# Patient Record
Sex: Female | Born: 1990 | Race: White | Hispanic: No | Marital: Married | Smoking: Never smoker
Health system: Southern US, Community
[De-identification: ages and names within clinical notes are randomized; demographics above are authoritative.]

## PROBLEM LIST (undated history)

## (undated) DIAGNOSIS — Z789 Other specified health status: Secondary | ICD-10-CM

## (undated) HISTORY — PX: WISDOM TOOTH EXTRACTION: SHX21

---
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
## 2016-07-13 DIAGNOSIS — R87610 Atypical squamous cells of undetermined significance on cytologic smear of cervix (ASC-US): Secondary | ICD-10-CM | POA: Diagnosis not present

## 2016-07-13 DIAGNOSIS — Z01419 Encounter for gynecological examination (general) (routine) without abnormal findings: Secondary | ICD-10-CM | POA: Diagnosis not present

## 2016-07-13 DIAGNOSIS — Z0000 Encounter for general adult medical examination without abnormal findings: Secondary | ICD-10-CM | POA: Diagnosis not present

## 2016-07-13 DIAGNOSIS — Z13228 Encounter for screening for other metabolic disorders: Secondary | ICD-10-CM | POA: Diagnosis not present

## 2016-07-13 DIAGNOSIS — Z113 Encounter for screening for infections with a predominantly sexual mode of transmission: Secondary | ICD-10-CM | POA: Diagnosis not present

## 2017-08-02 DIAGNOSIS — R6889 Other general symptoms and signs: Secondary | ICD-10-CM | POA: Diagnosis not present

## 2017-08-02 DIAGNOSIS — R233 Spontaneous ecchymoses: Secondary | ICD-10-CM | POA: Diagnosis not present

## 2017-10-06 ENCOUNTER — Other Ambulatory Visit: Payer: Self-pay | Admitting: Family Medicine

## 2017-10-06 ENCOUNTER — Other Ambulatory Visit (HOSPITAL_COMMUNITY)
Admission: RE | Admit: 2017-10-06 | Discharge: 2017-10-06 | Disposition: A | Discharge status: Home / Self Care | Payer: 59 | Source: Ambulatory Visit | Attending: Family Medicine | Admitting: Family Medicine

## 2017-10-06 DIAGNOSIS — Z01419 Encounter for gynecological examination (general) (routine) without abnormal findings: Secondary | ICD-10-CM | POA: Diagnosis not present

## 2017-10-06 DIAGNOSIS — Z124 Encounter for screening for malignant neoplasm of cervix: Secondary | ICD-10-CM | POA: Diagnosis not present

## 2017-10-06 DIAGNOSIS — Z131 Encounter for screening for diabetes mellitus: Secondary | ICD-10-CM | POA: Diagnosis not present

## 2017-10-06 DIAGNOSIS — Z0000 Encounter for general adult medical examination without abnormal findings: Secondary | ICD-10-CM | POA: Diagnosis not present

## 2017-10-06 DIAGNOSIS — Z13220 Encounter for screening for lipoid disorders: Secondary | ICD-10-CM | POA: Diagnosis not present

## 2017-10-06 DIAGNOSIS — Z23 Encounter for immunization: Secondary | ICD-10-CM | POA: Diagnosis not present

## 2017-10-12 LAB — CYTOLOGY - PAP
Diagnosis: UNDETERMINED — AB
HPV: NOT DETECTED

## 2017-10-21 DIAGNOSIS — R87610 Atypical squamous cells of undetermined significance on cytologic smear of cervix (ASC-US): Secondary | ICD-10-CM | POA: Diagnosis not present

## 2017-11-02 DIAGNOSIS — Z111 Encounter for screening for respiratory tuberculosis: Secondary | ICD-10-CM | POA: Diagnosis not present

## 2017-12-08 DIAGNOSIS — Z23 Encounter for immunization: Secondary | ICD-10-CM | POA: Diagnosis not present

## 2018-01-27 DIAGNOSIS — Z23 Encounter for immunization: Secondary | ICD-10-CM | POA: Diagnosis not present

## 2018-04-15 DIAGNOSIS — Z23 Encounter for immunization: Secondary | ICD-10-CM | POA: Diagnosis not present

## 2018-11-03 DIAGNOSIS — Z111 Encounter for screening for respiratory tuberculosis: Secondary | ICD-10-CM | POA: Diagnosis not present

## 2019-02-01 ENCOUNTER — Other Ambulatory Visit: Payer: Self-pay | Admitting: Obstetrics and Gynecology

## 2019-02-01 ENCOUNTER — Other Ambulatory Visit (HOSPITAL_COMMUNITY)
Admission: RE | Admit: 2019-02-01 | Discharge: 2019-02-01 | Disposition: A | Discharge status: Home / Self Care | Payer: BC Managed Care – PPO | Source: Ambulatory Visit | Attending: Obstetrics and Gynecology | Admitting: Obstetrics and Gynecology

## 2019-02-01 DIAGNOSIS — Z01419 Encounter for gynecological examination (general) (routine) without abnormal findings: Secondary | ICD-10-CM | POA: Diagnosis not present

## 2019-02-02 ENCOUNTER — Other Ambulatory Visit: Payer: Self-pay | Admitting: Obstetrics and Gynecology

## 2019-02-02 DIAGNOSIS — N6310 Unspecified lump in the right breast, unspecified quadrant: Secondary | ICD-10-CM

## 2019-02-06 LAB — CYTOLOGY - PAP
Diagnosis: NEGATIVE
Diagnosis: REACTIVE

## 2019-02-08 ENCOUNTER — Other Ambulatory Visit: Payer: Self-pay

## 2019-02-08 ENCOUNTER — Ambulatory Visit
Admission: RE | Admit: 2019-02-08 | Discharge: 2019-02-08 | Disposition: A | Discharge status: Home / Self Care | Payer: 59 | Source: Ambulatory Visit | Attending: Obstetrics and Gynecology | Admitting: Obstetrics and Gynecology

## 2019-02-08 DIAGNOSIS — N6310 Unspecified lump in the right breast, unspecified quadrant: Secondary | ICD-10-CM

## 2019-02-09 ENCOUNTER — Other Ambulatory Visit: Payer: 59

## 2019-03-15 ENCOUNTER — Other Ambulatory Visit: Payer: Self-pay

## 2019-03-15 DIAGNOSIS — Z20822 Contact with and (suspected) exposure to covid-19: Secondary | ICD-10-CM

## 2019-03-16 LAB — NOVEL CORONAVIRUS, NAA: SARS-CoV-2, NAA: NOT DETECTED

## 2020-09-09 ENCOUNTER — Other Ambulatory Visit: Payer: Self-pay | Admitting: Obstetrics & Gynecology

## 2020-09-09 DIAGNOSIS — Z3141 Encounter for fertility testing: Secondary | ICD-10-CM

## 2020-09-16 ENCOUNTER — Ambulatory Visit
Admission: RE | Admit: 2020-09-16 | Discharge: 2020-09-16 | Disposition: A | Discharge status: Home / Self Care | Payer: BC Managed Care – PPO | Source: Ambulatory Visit | Attending: Obstetrics & Gynecology | Admitting: Obstetrics & Gynecology

## 2020-09-16 DIAGNOSIS — Z3141 Encounter for fertility testing: Secondary | ICD-10-CM

## 2021-01-21 ENCOUNTER — Other Ambulatory Visit (HOSPITAL_COMMUNITY): Payer: Self-pay

## 2021-01-21 MED ORDER — CHORIOGONADOTROPIN ALFA 250 MCG/0.5ML ~~LOC~~ INJ
INJECTION | SUBCUTANEOUS | 0 refills | Status: DC
  Filled 2021-01-21: qty 0.5, 30d supply, fill #0

## 2021-02-19 ENCOUNTER — Other Ambulatory Visit (HOSPITAL_COMMUNITY): Payer: Self-pay

## 2021-02-19 MED ORDER — CHORIOGONADOTROPIN ALFA 250 MCG/0.5ML ~~LOC~~ INJ
0.5000 mL | INJECTION | Freq: Every day | SUBCUTANEOUS | 0 refills | Status: DC
  Filled 2021-02-19: qty 0.5, 1d supply, fill #0

## 2021-05-13 ENCOUNTER — Other Ambulatory Visit (HOSPITAL_COMMUNITY): Payer: Self-pay

## 2021-05-13 MED ORDER — CLOMIPHENE CITRATE 50 MG PO TABS
50.0000 mg | ORAL_TABLET | ORAL | 0 refills | Status: DC
  Filled 2021-05-13: qty 1, 1d supply, fill #0

## 2021-05-19 ENCOUNTER — Other Ambulatory Visit (HOSPITAL_COMMUNITY): Payer: Self-pay

## 2021-05-19 IMAGING — RF DG HYSTEROGRAM
3 series · 10 of 10 positions shown · IV contrast (omnipaque)
Comparison: None.

CLINICAL DATA: Primary female infertility evaluation.

EXAM:
HYSTEROSALPINGOGRAM
TECHNIQUE: Following cleansing of the cervix and vagina with Betadine solution,
a hysterosalpingogram was performed using a 5-French
hysterosalpingogram catheter and Omnipaque 300 contrast. The patient
tolerated the examination without difficulty.

[Series 1: one shot · 1 of 1 slices shown (1 of 2)]
[im 1/1]
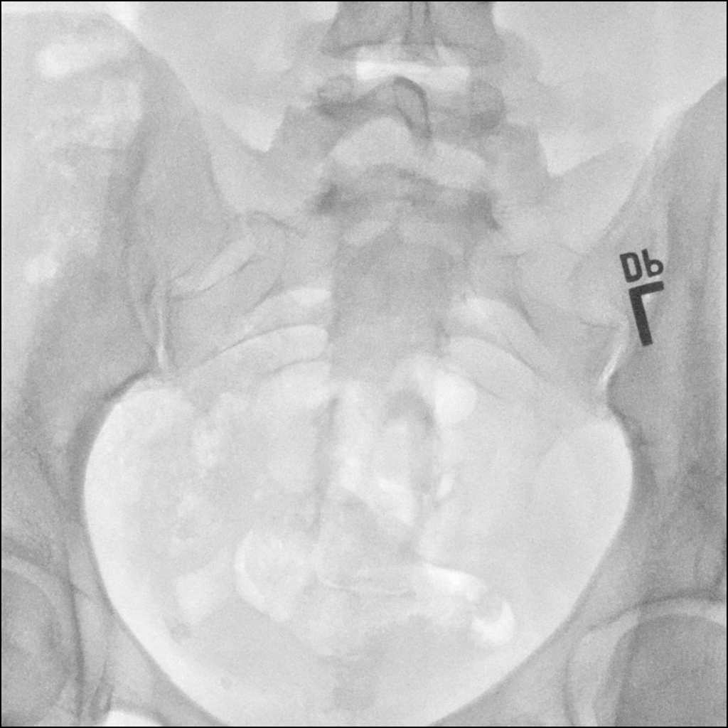

[Series 2: sequence · 4 of 14 frames shown]
[frame 3/14]
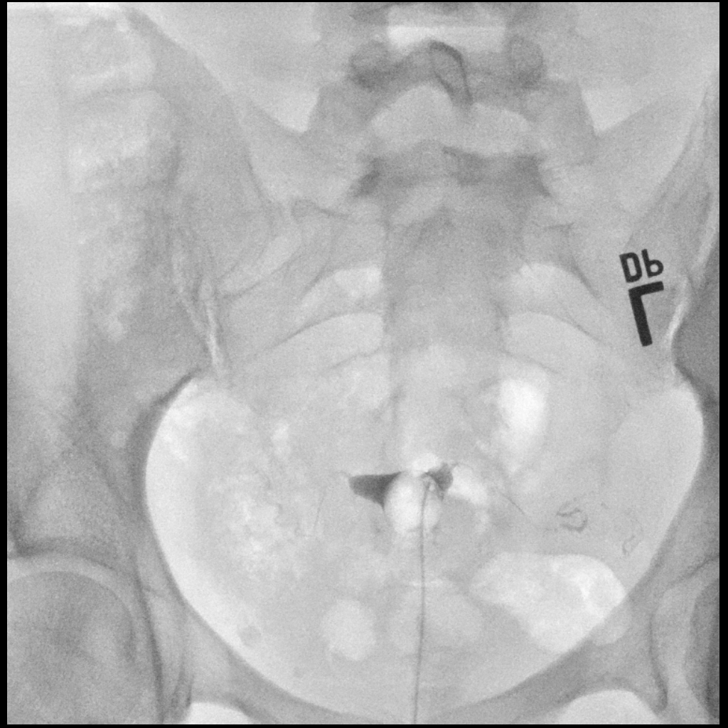
[frame 8/14]
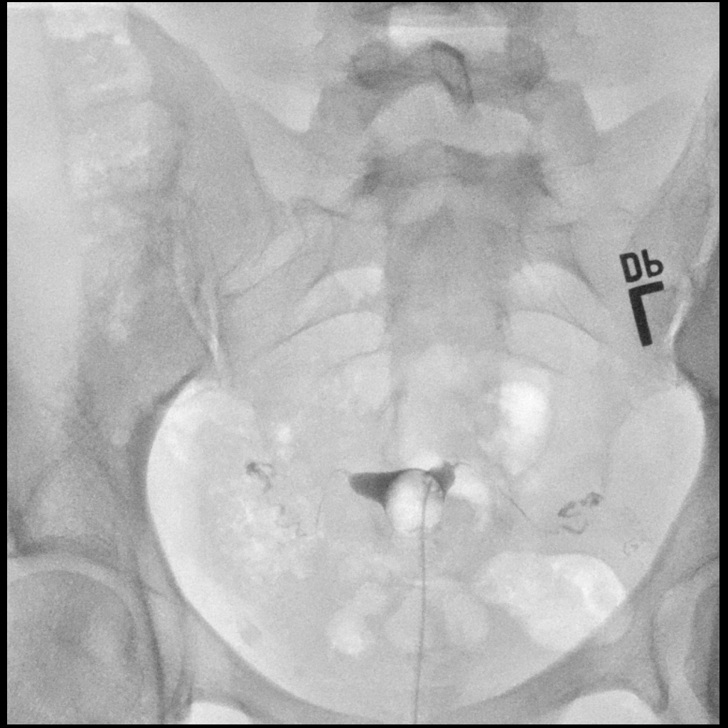
[frame 10/14]
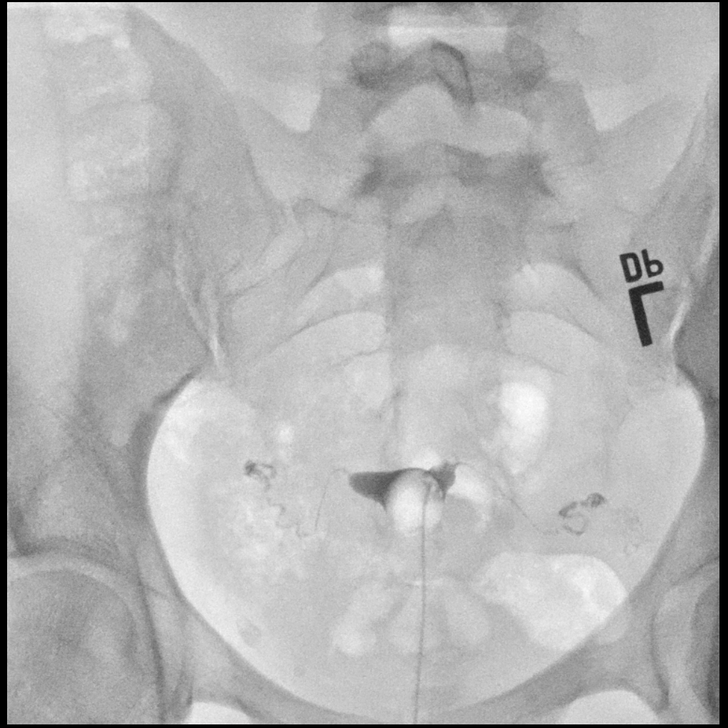
[frame 12/14]
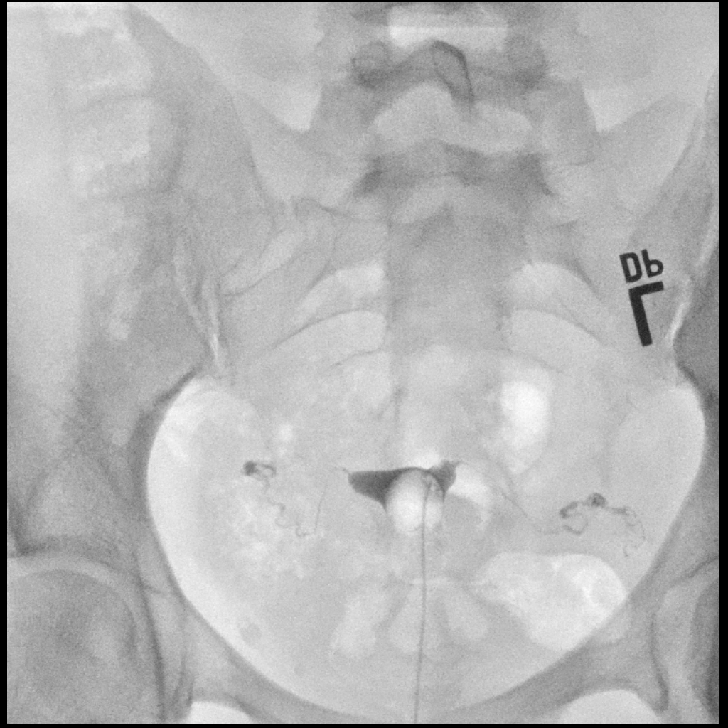

[Series 3: one shot · 0.15mm/px · 5 of 5 slices shown (2 of 2)]
[im 1/5]
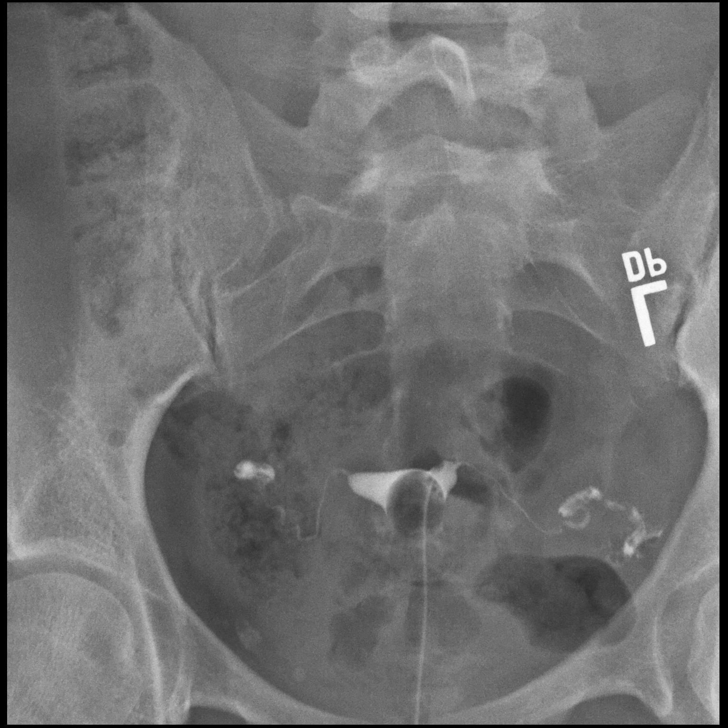
[im 2/5]
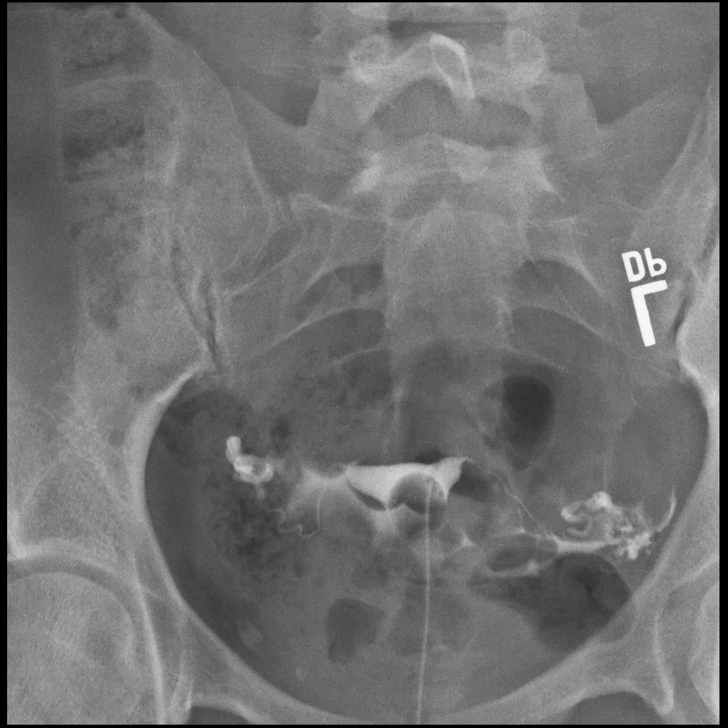
[im 3/5]
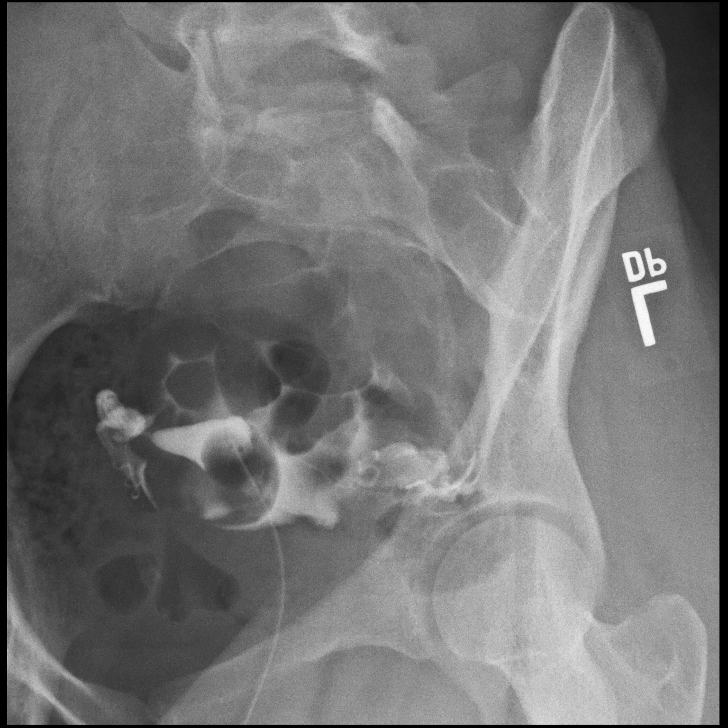
[im 4/5]
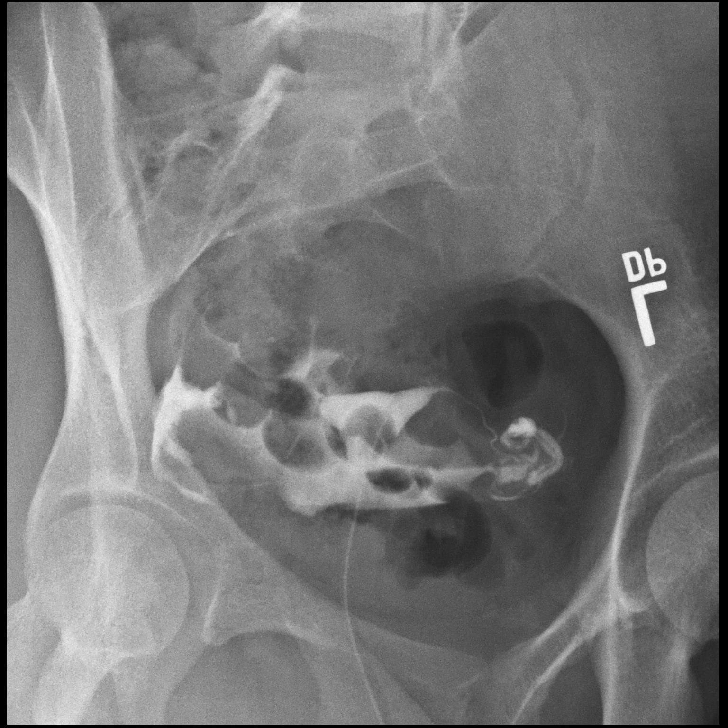
[im 5/5]
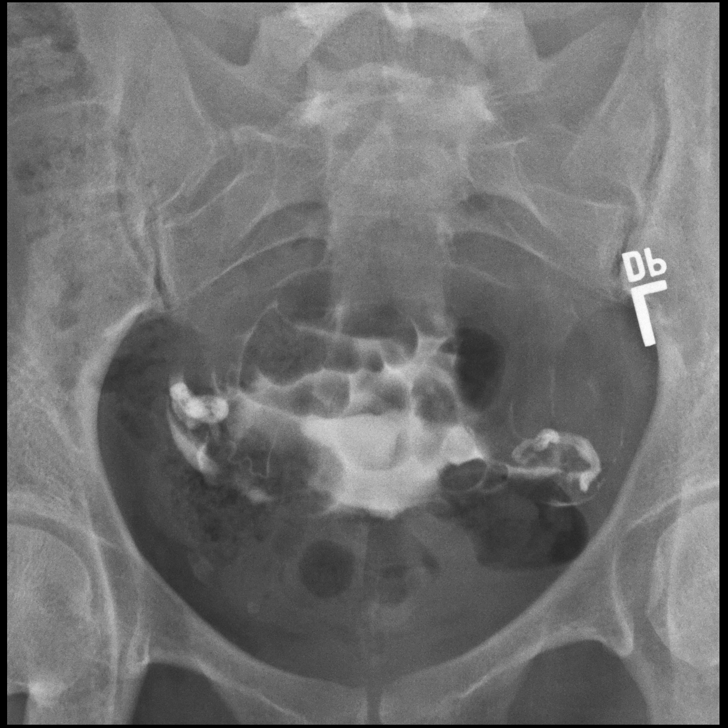

[10 of 10 positions shown; findings below may reference images not displayed]

FLUOROSCOPY TIME:  Radiation Exposure Index (as provided by the
fluoroscopic device): 7 mGy

Fluoroscopy Time:  0 minutes 30 seconds

Number of Acquired Images:  5
FINDINGS: Normal uterine cavity contour with no uterine cavity filling
defects.

Prompt opacification of the normal caliber and normal appearing
bilateral fallopian tubes in their entirety. Normal spillage of
contrast from the fimbriated ends of both fallopian tubes with
normal dispersal of contrast within the bilateral peritoneal cavity.
IMPRESSION: 1. Patent normal fallopian tubes bilaterally.
2. Normal uterine cavity.

## 2021-05-19 MED ORDER — OVIDREL 250 MCG/0.5ML ~~LOC~~ INJ
0.5000 mL | INJECTION | SUBCUTANEOUS | 0 refills | Status: DC
  Filled 2021-05-19: qty 0.5, 1d supply, fill #0

## 2021-07-03 LAB — HEPATITIS C ANTIBODY: HCV Ab: NEGATIVE

## 2021-07-03 LAB — OB RESULTS CONSOLE ANTIBODY SCREEN: Antibody Screen: NEGATIVE

## 2021-07-03 LAB — OB RESULTS CONSOLE HEPATITIS B SURFACE ANTIGEN: Hepatitis B Surface Ag: NEGATIVE

## 2021-07-03 LAB — OB RESULTS CONSOLE HIV ANTIBODY (ROUTINE TESTING): HIV: NONREACTIVE

## 2021-07-03 LAB — OB RESULTS CONSOLE ABO/RH: RH Type: NEGATIVE

## 2021-07-03 LAB — OB RESULTS CONSOLE RUBELLA ANTIBODY, IGM: Rubella: IMMUNE

## 2021-07-03 LAB — OB RESULTS CONSOLE RPR: RPR: NONREACTIVE

## 2021-07-17 LAB — OB RESULTS CONSOLE GC/CHLAMYDIA
Chlamydia: NEGATIVE
Neisseria Gonorrhea: NEGATIVE

## 2021-07-18 ENCOUNTER — Encounter (HOSPITAL_COMMUNITY): Payer: Self-pay | Admitting: Obstetrics and Gynecology

## 2021-07-18 ENCOUNTER — Inpatient Hospital Stay (HOSPITAL_COMMUNITY): Payer: BC Managed Care – PPO

## 2021-07-18 ENCOUNTER — Inpatient Hospital Stay (HOSPITAL_COMMUNITY)
Admission: AD | Admit: 2021-07-18 | Discharge: 2021-07-18 | Disposition: A | Discharge status: Home / Self Care | Payer: BC Managed Care – PPO | Attending: Obstetrics and Gynecology | Admitting: Obstetrics and Gynecology

## 2021-07-18 ENCOUNTER — Other Ambulatory Visit: Payer: Self-pay

## 2021-07-18 DIAGNOSIS — Z3A10 10 weeks gestation of pregnancy: Secondary | ICD-10-CM | POA: Insufficient documentation

## 2021-07-18 DIAGNOSIS — R1031 Right lower quadrant pain: Secondary | ICD-10-CM | POA: Insufficient documentation

## 2021-07-18 DIAGNOSIS — M25511 Pain in right shoulder: Secondary | ICD-10-CM | POA: Insufficient documentation

## 2021-07-18 DIAGNOSIS — O26891 Other specified pregnancy related conditions, first trimester: Secondary | ICD-10-CM | POA: Insufficient documentation

## 2021-07-18 DIAGNOSIS — M25512 Pain in left shoulder: Secondary | ICD-10-CM | POA: Insufficient documentation

## 2021-07-18 HISTORY — DX: Other specified health status: Z78.9

## 2021-07-18 NOTE — Discharge Instructions (Signed)
Return to MAU: °If you have heavy bleeding that soaks through more that 2 pads per hour for an hour or more °If you bleed so much that you feel like you might pass out or you do pass out °If you have significant abdominal pain that is not improved with Tylenol 1000 mg every 8 hours as needed for pain °If you develop a fever > 100.5  °

## 2021-07-18 NOTE — MAU Provider Note (Signed)
?History  ?  ? ?CSN: [id number] ? ?Arrival date and time: 07/18/21 1637 ? ? Event Date/Time  ? First Provider Initiated Contact with Patient 07/18/21 1805   ?  ? ?Chief Complaint  ?Patient presents with  ? Shoulder Pain  ? ?[name] is a 31 y.o. year old G3P0 female at [redacted]w[redacted]d weeks gestation who presents to MAU reporting sudden sharp pain just under her RT clavicle bone that occurs with deep breathing or laughing yesterday and some epigastric pain that went away. The pain in her clavicle came back this afternoon along with a sharp pain in RLQ; rated 5/10. She conceived this pregnancy using IUI and Clomid. She has had 2 ultrasounds documenting an IUP at 7 weeks and at 10.1 weeks (yesterday). She called the RN line and was told to come MAU for evaluation. She is concerned that she may have a heterotopic pregnancy, because she had 2 dominant follicles on her RT ovary and 1 dominant follicle on her LT ovary. Her mother is present and contributing to the history taking. ? ? ?OB History   ? ? Gravida  ?1  ? Para  ?   ? Term  ?   ? Preterm  ?   ? AB  ?   ? Living  ?   ?  ? ? SAB  ?   ? IAB  ?   ? Ectopic  ?   ? Multiple  ?   ? Live Births  ?   ?   ?  ?  ? ? ?Past Medical History:  ?Diagnosis Date  ? Medical history non-contributory   ? ? ?Past Surgical History:  ?Procedure Laterality Date  ? WISDOM TOOTH EXTRACTION    ? ? ?Family History  ?Problem Relation Age of Onset  ? Diabetes Father   ? ? ?Social History  ? ?Tobacco Use  ? Smoking status: Never  ? Smokeless tobacco: Never  ?Substance Use Topics  ? Alcohol use: Never  ? Drug use: Never  ? ? ?Allergies: No Known Allergies ? ?Medications Prior to Admission  ?Medication Sig Dispense Refill Last Dose  ? Prenatal Vit-Fe Fumarate-FA (PRENATAL MULTIVITAMIN) TABS tablet Take 1 tablet by mouth daily at 12 noon.   07/17/2021  ? progesterone (ENDOMETRIN) 100 MG vaginal insert Place 100 mg vaginally 2 (two) times daily.   07/17/2021  ? Choriogonadotropin Alfa 250  MCG/0.5ML injection Inject 0.5 mLs (0.25 mg total) into the skin daily for 1 day 0.5 mL 0   ? clomiPHENE (CLOMID) 50 MG tablet Take 1 tablet (50 mg total) by mouth daily on days 5 though 9 on cycle 1 tablet 0   ? ? ?Review of Systems  ?Constitutional: Negative.   ?HENT: Negative.    ?Eyes: Negative.   ?Respiratory: Negative.    ?Cardiovascular: Negative.   ?Gastrointestinal:  Negative for abdominal pain (epigastric pain yesterday went away).  ?Endocrine: Negative.   ?Genitourinary:  Positive for pelvic pain (sharp pain RLQ).  ?Musculoskeletal:   ?     Pain just under RT clavicle  ?Skin: Negative.   ?Allergic/Immunologic: Negative.   ?Neurological: Negative.   ?Hematological: Negative.   ?Psychiatric/Behavioral: Negative.    ? ?Physical Exam  ? ?Blood pressure 132/78, pulse 91, temperature 98.7 ?F (37.1 ?C), resp. rate 18, last menstrual period 05/07/2021. ? ?Physical Exam ?Vitals and nursing note reviewed.  ?Constitutional:   ?   Appearance: Normal appearance. She is normal weight.  ?Cardiovascular:  ?   Rate and Rhythm:  Normal rate.  ?Pulmonary:  ?   Effort: Pulmonary effort is normal.  ?Abdominal:  ?   General: Abdomen is flat.  ?   Palpations: Abdomen is soft.  ?   Tenderness: There is no abdominal tenderness.  ?Genitourinary: ?   Comments: Not indicated ?Musculoskeletal:     ?   General: Normal range of motion.  ?Skin: ?   General: Skin is warm and dry.  ?Neurological:  ?   Mental Status: She is alert and oriented to person, place, and time.  ?Psychiatric:     ?   Mood and Affect: Mood normal.     ?   Behavior: Behavior normal.     ?   Thought Content: Thought content normal.     ?   Judgment: Judgment normal.  ? ?FHTs not heard by doppler ?MAU Course  ?Procedures ? ?MDM ?OB Complete <14 wks [place] ? ? US OB Comp Less 14 Wks ? ?Result Date: 07/18/2021 ?CLINICAL DATA:  Right lower quadrant pain. Last menstrual period 05/07/2021 which would correspond to gestational age of [redacted] weeks 2 days. EXAM: OBSTETRIC <14 WK [place] AND  TRANSVAGINAL OB US TECHNIQUE: Both transabdominal and transvaginal ultrasound examinations were performed for complete evaluation of the gestation as well as the maternal uterus, adnexal regions, and pelvic cul-de-sac. Transvaginal technique was performed to assess early pregnancy. COMPARISON:  None. FINDINGS: Intrauterine gestational sac: Single Yolk sac:  Not Visualized. Embryo:  Visualized. Cardiac Activity: Visualized. Heart Rate: 164 bpm CRL:  38 mm   10 w   4 d                  [place] EDC: 02/09/2022 Subchorionic hemorrhage: There is prominent vascularity seen adjacent to the placenta. This measures up to approximately 2.7 x 1.1 cm. This may represent a placental Lake. Recommend attention on follow-up. Maternal uterus/adnexae: Maternal right ovary measures 4.7 x 2.6 x 2.6 cm in the maternal left ovary measures 4.4 x 2.4 by 2.6 cm. Normal color flow vascularity within the bilateral ovaries. IMPRESSION:: IMPRESSION: 1. Single live intrauterine pregnancy with crown-rump length corresponding to estimated gestational age of [redacted] weeks 4 days. 2. There is prominent vascularity seen adjacent to the placenta. At the periphery of the placenta there is a hypoechoic region measuring up to 2.7 cm that may represent a placental lake. A subchorionic hematoma is considered but felt less likely. Recommend attention on follow-up. Electronically Signed   By: Yvonne Kendall M.D.   On: 07/18/2021 19:10    ? ?Assessment and Plan  ?Acute pain of right shoulder  ?- Advised that shoulder pain could possibly be a result of gas ?- Chances of ectopic or heterotopic pregnancy are unlikely in the setting of a documented viable pregnancy at 7 weeks and 10 weeks ? ?Acute right lower quadrant pain ?- Advised that pain could be a normal variation of pregnancy at this gestation ? ?[redacted] weeks gestation of pregnancy  ? ?- Discharge patient ?- Keep scheduled appointment with [facility] ?- Patient verbalized an understanding of the plan of care  and agrees.  ? ? ?Laury Deep, CNM ?07/18/2021, 5:00 PM  ?

## 2021-07-18 NOTE — MAU Note (Signed)
.  Sheena Moore is a 31 y.o. at [redacted]w[redacted]d here in MAU reporting: she had started having sharp pain (when she takes a deep breath in or laughs) in her right clavicle yesterday,then had some epigastric pain that went away. Pain in clavicle came back this afternoon had some sharp pain in her RLQ. Called nurse line and they told her to come in. Pt stated this pregnancy was an IUI with clomed  and has had several U/S   that had showed a intrauterine pregnancy with a heartbeat.  ?LMP: 05/07/21 ?Onset of complaint: yesterday ?Pain score: 5 ?Vitals:  ? 07/18/21 1732  ?BP: 132/77  ?Pulse: 84  ?Resp: 18  ?Temp: 98.7 ?F (37.1 ?C)  ?   ?Lab orders placed from triage:   ? ?

## 2021-12-19 ENCOUNTER — Other Ambulatory Visit: Payer: Self-pay

## 2021-12-19 ENCOUNTER — Other Ambulatory Visit: Payer: Self-pay | Admitting: Obstetrics and Gynecology

## 2021-12-19 DIAGNOSIS — O283 Abnormal ultrasonic finding on antenatal screening of mother: Secondary | ICD-10-CM

## 2021-12-19 DIAGNOSIS — Z363 Encounter for antenatal screening for malformations: Secondary | ICD-10-CM

## 2021-12-19 DIAGNOSIS — Z3A32 32 weeks gestation of pregnancy: Secondary | ICD-10-CM

## 2021-12-23 ENCOUNTER — Ambulatory Visit: Payer: BC Managed Care – PPO | Admitting: *Deleted

## 2021-12-23 ENCOUNTER — Ambulatory Visit: Payer: BC Managed Care – PPO | Attending: Maternal & Fetal Medicine | Admitting: Maternal & Fetal Medicine

## 2021-12-23 ENCOUNTER — Ambulatory Visit: Payer: BC Managed Care – PPO | Attending: Obstetrics and Gynecology

## 2021-12-23 ENCOUNTER — Other Ambulatory Visit: Payer: Self-pay | Admitting: *Deleted

## 2021-12-23 DIAGNOSIS — Z3A32 32 weeks gestation of pregnancy: Secondary | ICD-10-CM | POA: Insufficient documentation

## 2021-12-23 DIAGNOSIS — O283 Abnormal ultrasonic finding on antenatal screening of mother: Secondary | ICD-10-CM

## 2021-12-23 DIAGNOSIS — Z363 Encounter for antenatal screening for malformations: Secondary | ICD-10-CM | POA: Insufficient documentation

## 2021-12-24 NOTE — Progress Notes (Signed)
MFM Brief Note  Sheena Moore is a 31 yo G1P0 at 33 weeks. She is seen at the request of Dr. Damaris Hippo due to an outside exam with suspected umbilical cord cyst.  Sheena Moore notes that her pregnancy is overall normal besides early trimester early bleeding and suspected placental lake.  She has a low risk NIPT, neg AFP and  Horizon.  Single intrauterine pregnancy here for a detailed anatomy due to a suspected cord cyst on and outside.  Normal anatomy with measurements consistent with dates There is good fetal movement and amniotic fluid volume Suboptimal views of the fetal anatomy were obtained secondary to fetal position and advanced gestational age.   Today way observed a placental cyst that is adjacent to the umbilical cord. It appears as a subamniotic hematoma that is no longer active as there is no color flow to the area. I discussed with Sheena Moore that this is  classic finding and that their is an increased risk for fetal growth restriction but overall outcomes are good.   I therefore recommend serial growth exam every 4 weeks until delivery. If fetal growth restriction present we recommend weekly testing with UA dopplers with delivery between 37-39 weeks.   I spent 20 minutes with > 50% in face to face consultation.  Novella Olive, MD

## 2022-01-20 ENCOUNTER — Ambulatory Visit: Payer: BC Managed Care – PPO | Admitting: *Deleted

## 2022-01-20 ENCOUNTER — Ambulatory Visit (HOSPITAL_BASED_OUTPATIENT_CLINIC_OR_DEPARTMENT_OTHER): Payer: BC Managed Care – PPO | Admitting: *Deleted

## 2022-01-20 ENCOUNTER — Ambulatory Visit: Payer: BC Managed Care – PPO | Attending: Maternal & Fetal Medicine

## 2022-01-20 VITALS — BP 129/84 | HR 103

## 2022-01-20 DIAGNOSIS — O283 Abnormal ultrasonic finding on antenatal screening of mother: Secondary | ICD-10-CM

## 2022-01-20 DIAGNOSIS — Z3A36 36 weeks gestation of pregnancy: Secondary | ICD-10-CM

## 2022-01-20 DIAGNOSIS — O09813 Supervision of pregnancy resulting from assisted reproductive technology, third trimester: Secondary | ICD-10-CM

## 2022-01-20 NOTE — Procedures (Signed)
Sheena Moore 10/05/1990 [redacted]w[redacted]d  Fetus A Non-Stress Test Interpretation for 01/20/22  Indication:  tachycardia   Fetal Heart Rate A Mode: External Baseline Rate (A): 150 bpm Variability: Minimal, Moderate Accelerations: 15 x 15 Decelerations: None Multiple birth?: No  Uterine Activity Mode: Toco Contraction Frequency (min): ui Resting Tone Palpated: Relaxed Resting Time: Adequate  Interpretation (Fetal Testing) Nonstress Test Interpretation: Reactive Overall Impression: Reassuring for gestational age Comments: tracing reviewed by Dr. Parke Poisson

## 2022-01-22 ENCOUNTER — Encounter (HOSPITAL_COMMUNITY): Payer: Self-pay

## 2022-01-22 LAB — OB RESULTS CONSOLE GBS: GBS: NEGATIVE

## 2022-01-22 NOTE — Patient Instructions (Signed)
JERRE DIGUGLIELMO  01/22/2022   Your procedure is scheduled on:  02/05/2022  Arrive at 0530 at Entrance C on [address] at [facility]  and [address]. You are invited to use the FREE valet parking or use the Visitor's parking deck.  Pick up the phone at the desk and dial [phone].  Call this number if you have problems the morning of surgery: [phone]  Remember:   Do not eat food:(After Midnight) Desps de medianoche.  Do not drink clear liquids: (After Midnight) Desps de medianoche.  Take these medicines the morning of surgery with A SIP OF WATER:  none   Do not wear jewelry, make-up or nail polish.  Do not wear lotions, powders, or perfumes. Do not wear deodorant.  Do not shave 48 hours prior to surgery.  Do not bring valuables to the hospital.  [facility] is not   responsible for any belongings or valuables brought to the hospital.  Contacts, dentures or bridgework may not be worn into surgery.  Leave suitcase in the car. After surgery it may be brought to your room.  For patients admitted to the hospital, checkout time is 11:00 AM the day of              discharge.      Please read over the following fact sheets that you were given:     Preparing for Surgery

## 2022-02-02 ENCOUNTER — Inpatient Hospital Stay (EMERGENCY_DEPARTMENT_HOSPITAL)
Admission: AD | Admit: 2022-02-02 | Discharge: 2022-02-02 | Disposition: A | Discharge status: Home / Self Care | Payer: BC Managed Care – PPO | Source: Home / Self Care | Attending: Obstetrics and Gynecology | Admitting: Obstetrics and Gynecology

## 2022-02-02 DIAGNOSIS — Z3493 Encounter for supervision of normal pregnancy, unspecified, third trimester: Secondary | ICD-10-CM

## 2022-02-02 DIAGNOSIS — Z3A38 38 weeks gestation of pregnancy: Secondary | ICD-10-CM

## 2022-02-02 DIAGNOSIS — Z0371 Encounter for suspected problem with amniotic cavity and membrane ruled out: Secondary | ICD-10-CM | POA: Insufficient documentation

## 2022-02-02 DIAGNOSIS — R109 Unspecified abdominal pain: Secondary | ICD-10-CM | POA: Diagnosis not present

## 2022-02-02 DIAGNOSIS — O321XX0 Maternal care for breech presentation, not applicable or unspecified: Secondary | ICD-10-CM | POA: Diagnosis not present

## 2022-02-02 LAB — POCT FERN TEST

## 2022-02-02 LAB — AMNISURE RUPTURE OF MEMBRANE (ROM) NOT AT ARMC: Amnisure ROM: NEGATIVE

## 2022-02-02 NOTE — MAU Provider Note (Signed)
  History     CSN: [id number]  Arrival date and time: 02/02/22 4034   Event Date/Time   First Provider Initiated Contact with Patient 02/02/22 [phone]      Chief Complaint  Patient presents with   Rupture of Membranes   Abdominal Pain   HPI 31yo G1P0 at 38 weeks 5 days who presents with leaking fluid that started last night.  She had 2 fairly large white spots and came in to be evaluated.  She denies contractions.  Good fetal movement.  No vaginal bleeding.  OB History     Gravida  1   Para      Term      Preterm      AB      Living         SAB      IAB      Ectopic      Multiple      Live Births              Past Medical History:  Diagnosis Date   Medical history non-contributory     Past Surgical History:  Procedure Laterality Date   WISDOM TOOTH EXTRACTION      Family History  Problem Relation Age of Onset   Diabetes Father    Stroke Maternal Grandmother    Asthma Neg Hx    Cancer Neg Hx    Heart disease Neg Hx    Intellectual disability Neg Hx     Social History   Tobacco Use   Smoking status: Never   Smokeless tobacco: Never  Vaping Use   Vaping Use: Never used  Substance Use Topics   Alcohol use: Never   Drug use: Never    Allergies: No Known Allergies  Medications Prior to Admission  Medication Sig Dispense Refill Last Dose   calcium carbonate (TUMS - DOSED IN MG ELEMENTAL CALCIUM) 500 MG chewable tablet Chew 1 tablet by mouth at bedtime.      famotidine (PEPCID) 20 MG tablet Take 20 mg by mouth at bedtime.      Prenatal Vit-Fe Fumarate-FA (PRENATAL MULTIVITAMIN) TABS tablet Take 1 tablet by mouth at bedtime.       Review of Systems Physical Exam   Blood pressure 129/86, pulse 98, temperature 98 F (36.7 C), temperature source Oral, resp. rate 18, height 5\' 4"  (1.626 m), weight 87.8 kg, last menstrual period 05/07/2021.  Physical Exam Vitals reviewed.  Constitutional:      Appearance: She is well-developed.   Skin:    General: Skin is warm and dry.  Neurological:     General: No focal deficit present.     Mental Status: She is alert and oriented to person, place, and time.    Ferning negative Amnisure: negative  MAU Course  Procedures NST:  Baseline: 150  Variability: moderate Accelerations: present  Decelerations: none Contractions: none  MDM   Assessment and Plan   1. [redacted] weeks gestation of pregnancy   2. Intact amniotic membranes during pregnancy in third trimester    Discharge to home. Return precautions given.  Truett Mainland 02/02/2022, 8:46 AM

## 2022-02-02 NOTE — MAU Note (Signed)
Sheena Moore is a 31 y.o. at [redacted]w[redacted]d here in MAU reporting: large wet spot noted in pants, has continued.  First noted around 5 yesterday.  Some cramping. No bleeding.  +FM.  Baby is breech, for scheduled c/s.   Onset of complaint: 1700 yesterday Pain score: 4 There were no vitals filed for this visit.   FHT:+FM Lab orders placed from triage:  fern  Pt taken to rm

## 2022-02-02 NOTE — MAU Note (Signed)
Noticed wet spots in shorts about 5pm on 9/24 that has increased this morning with cramping.

## 2022-02-03 ENCOUNTER — Other Ambulatory Visit (HOSPITAL_COMMUNITY)
Admission: RE | Admit: 2022-02-03 | Discharge: 2022-02-03 | Disposition: A | Discharge status: Home / Self Care | Payer: BC Managed Care – PPO | Source: Ambulatory Visit | Attending: Obstetrics & Gynecology | Admitting: Obstetrics & Gynecology

## 2022-02-03 DIAGNOSIS — O321XX0 Maternal care for breech presentation, not applicable or unspecified: Secondary | ICD-10-CM | POA: Insufficient documentation

## 2022-02-03 LAB — TYPE AND SCREEN
ABO/RH(D): A NEG
Antibody Screen: POSITIVE

## 2022-02-03 LAB — CBC
HCT: 36.9 % (ref 36.0–46.0)
Hemoglobin: 12.2 g/dL (ref 12.0–15.0)
MCH: 33.7 pg (ref 26.0–34.0)
MCHC: 33.1 g/dL (ref 30.0–36.0)
MCV: 101.9 fL — ABNORMAL HIGH (ref 80.0–100.0)
Platelets: 197 10*3/uL (ref 150–400)
RBC: 3.62 MIL/uL — ABNORMAL LOW (ref 3.87–5.11)
RDW: 12.6 % (ref 11.5–15.5)
WBC: 7.7 10*3/uL (ref 4.0–10.5)
nRBC: 0 % (ref 0.0–0.2)

## 2022-02-03 LAB — RPR: RPR Ser Ql: NONREACTIVE

## 2022-02-03 NOTE — Anesthesia Preprocedure Evaluation (Addendum)
Anesthesia Evaluation  Patient identified by MRN, date of birth, ID band Patient awake    Reviewed: Allergy & Precautions, NPO status , Patient's Chart, lab work & pertinent test results  History of Anesthesia Complications Negative for: history of anesthetic complications  Airway Mallampati: II  TM Distance: >3 FB Neck ROM: Full    Dental no notable dental hx.    Pulmonary neg pulmonary ROS,    Pulmonary exam normal        Cardiovascular negative cardio ROS Normal cardiovascular exam     Neuro/Psych negative neurological ROS     GI/Hepatic Neg liver ROS, GERD  Medicated,  Endo/Other  negative endocrine ROS  Renal/GU negative Renal ROS  negative genitourinary   Musculoskeletal negative musculoskeletal ROS (+)   Abdominal   Peds  Hematology negative hematology ROS (+)   Anesthesia Other Findings Day of surgery medications reviewed with patient.  Reproductive/Obstetrics (+) Pregnancy (breech presentation)                           Anesthesia Physical Anesthesia Plan  ASA: 2  Anesthesia Plan: Spinal   Post-op Pain Management:    Induction:   PONV Risk Score and Plan: 4 or greater and Treatment may vary due to age or medical condition, Ondansetron and Dexamethasone  Airway Management Planned: Natural Airway  Additional Equipment: None  Intra-op Plan:   Post-operative Plan:   Informed Consent: I have reviewed the patients History and Physical, chart, labs and discussed the procedure including the risks, benefits and alternatives for the proposed anesthesia with the patient or authorized representative who has indicated his/her understanding and acceptance.       Plan Discussed with: CRNA  Anesthesia Plan Comments:        Anesthesia Quick Evaluation

## 2022-02-03 NOTE — H&P (Signed)
Sheena Moore is a 31 y.o. female G1 at 24 weeks presenting for primary C/S for breech presentation.  Patient declined ECV.  Antepartum course complicated by u/s finding of placental cyst adjacent to umbilical cord insertion.  She saw MFM for this and recommendation was to have serial growth u/s.  Last u/s on 9/12 with MFM which showed appropriate growth 6#15 (65%).  Group B strep negative.  Rh negative.  OB History     Gravida  1   Para      Term      Preterm      AB      Living         SAB      IAB      Ectopic      Multiple      Live Births             Past Medical History:  Diagnosis Date   Medical history non-contributory    Past Surgical History:  Procedure Laterality Date   WISDOM TOOTH EXTRACTION     Family History: family history includes Diabetes in her father; Stroke in her maternal grandmother. Social History:  reports that she has never smoked. She has never used smokeless tobacco. She reports that she does not drink alcohol and does not use drugs.     Maternal Diabetes: No Genetic Screening: Normal Maternal Ultrasounds/Referrals: Other:placental cyst Fetal Ultrasounds or other Referrals:  Referred to Materal Fetal Medicine  Maternal Substance Abuse:  No Significant Maternal Medications:  None Significant Maternal Lab Results:  Group B Strep negative and Rh negative Number of Prenatal Visits:greater than 3 verified prenatal visits Other Comments:  None  Review of Systems Maternal Medical History:  Prenatal complications: no prenatal complications Prenatal Complications - Diabetes: none.     Last menstrual period 05/07/2021. Maternal Exam:  Abdomen: Patient reports no abdominal tenderness. Fundal height is c/w dates.   Estimated fetal weight is 7#8.   Fetal presentation: breech   Physical Exam Constitutional:      Appearance: Normal appearance.  HENT:     Head: Normocephalic and atraumatic.  Pulmonary:     Effort:  Pulmonary effort is normal.  Abdominal:     Palpations: Abdomen is soft.  Musculoskeletal:        General: Normal range of motion.     Cervical back: Normal range of motion.  Skin:    General: Skin is warm and dry.  Neurological:     Mental Status: She is alert and oriented to person, place, and time.  Psychiatric:        Mood and Affect: Mood normal.        Behavior: Behavior normal.     Prenatal labs: ABO, Rh: A/Negative/-- (02/23 0000) Antibody: Negative (02/23 0000) Rubella: Immune (02/23 0000) RPR: Nonreactive (02/23 0000)  HBsAg: Negative (02/23 0000)  HIV: Non-reactive (02/23 0000)  GBS:   Negative  Assessment/Plan: 31yo G1 at 39 weeks with breech presentation -Primary C/S -Patient is counseled re: risk of bleeding, infection, scarring and damage to surrounding structures.  She is informed of implications in future pregnancies to include abnormal placentation as well as uterine rupture.  All questions were answered and patient wishes to proceed.   Linda Hedges 02/03/2022, 8:24 AM

## 2022-02-05 ENCOUNTER — Inpatient Hospital Stay (HOSPITAL_COMMUNITY)
Admission: RE | Admit: 2022-02-05 | Discharge: 2022-02-07 | DRG: 787 | Disposition: A | Discharge status: Home / Self Care | Payer: BC Managed Care – PPO | Attending: Obstetrics & Gynecology | Admitting: Obstetrics & Gynecology

## 2022-02-05 ENCOUNTER — Encounter (HOSPITAL_COMMUNITY): Payer: Self-pay | Admitting: Obstetrics & Gynecology

## 2022-02-05 ENCOUNTER — Inpatient Hospital Stay (HOSPITAL_COMMUNITY): Payer: BC Managed Care – PPO | Admitting: Anesthesiology

## 2022-02-05 ENCOUNTER — Other Ambulatory Visit: Payer: Self-pay

## 2022-02-05 ENCOUNTER — Encounter (HOSPITAL_COMMUNITY)
Admission: RE | Disposition: A | Discharge status: Home / Self Care | Payer: Self-pay | Source: Home / Self Care | Attending: Obstetrics & Gynecology

## 2022-02-05 DIAGNOSIS — Z6791 Unspecified blood type, Rh negative: Secondary | ICD-10-CM

## 2022-02-05 DIAGNOSIS — O26893 Other specified pregnancy related conditions, third trimester: Secondary | ICD-10-CM | POA: Diagnosis present

## 2022-02-05 DIAGNOSIS — O9081 Anemia of the puerperium: Secondary | ICD-10-CM | POA: Diagnosis not present

## 2022-02-05 DIAGNOSIS — O43893 Other placental disorders, third trimester: Secondary | ICD-10-CM | POA: Diagnosis present

## 2022-02-05 DIAGNOSIS — Z3A39 39 weeks gestation of pregnancy: Secondary | ICD-10-CM | POA: Diagnosis not present

## 2022-02-05 DIAGNOSIS — Z98891 History of uterine scar from previous surgery: Secondary | ICD-10-CM

## 2022-02-05 DIAGNOSIS — O321XX0 Maternal care for breech presentation, not applicable or unspecified: Principal | ICD-10-CM | POA: Diagnosis present

## 2022-02-05 DIAGNOSIS — D62 Acute posthemorrhagic anemia: Secondary | ICD-10-CM | POA: Diagnosis not present

## 2022-02-05 LAB — ABO/RH: ABO/RH(D): A NEG

## 2022-02-05 SURGERY — Surgical Case
Anesthesia: Spinal

## 2022-02-05 MED ORDER — CEFAZOLIN SODIUM-DEXTROSE 2-4 GM/100ML-% IV SOLN
INTRAVENOUS | Status: AC
  Filled 2022-02-05: qty 100

## 2022-02-05 MED ORDER — DIPHENHYDRAMINE HCL 25 MG PO CAPS: 25.0000 mg | ORAL_CAPSULE | Freq: Four times a day (QID) | ORAL | Status: DC | PRN

## 2022-02-05 MED ORDER — NALOXONE HCL 0.4 MG/ML IJ SOLN: 0.4000 mg | INTRAMUSCULAR | Status: DC | PRN

## 2022-02-05 MED ORDER — ACETAMINOPHEN 160 MG/5ML PO SOLN: 1000.0000 mg | Freq: Once | ORAL | Status: DC

## 2022-02-05 MED ORDER — MORPHINE SULFATE (PF) 0.5 MG/ML IJ SOLN
INTRAMUSCULAR | Status: DC | PRN
  Administered 2022-02-05: .15 mg via INTRATHECAL

## 2022-02-05 MED ORDER — OXYTOCIN-SODIUM CHLORIDE 30-0.9 UT/500ML-% IV SOLN
INTRAVENOUS | Status: DC | PRN
  Administered 2022-02-05: 250 mL via INTRAVENOUS

## 2022-02-05 MED ORDER — LACTATED RINGERS IV SOLN: INTRAVENOUS | Status: DC

## 2022-02-05 MED ORDER — NALOXONE HCL 4 MG/10ML IJ SOLN: 1.0000 ug/kg/h | INTRAVENOUS | Status: DC | PRN

## 2022-02-05 MED ORDER — MORPHINE SULFATE (PF) 0.5 MG/ML IJ SOLN
INTRAMUSCULAR | Status: AC
  Filled 2022-02-05: qty 10

## 2022-02-05 MED ORDER — SENNOSIDES-DOCUSATE SODIUM 8.6-50 MG PO TABS
2.0000 | ORAL_TABLET | Freq: Every day | ORAL | Status: DC
  Administered 2022-02-06 – 2022-02-07 (×2): 2 via ORAL
  Filled 2022-02-05 (×2): qty 2

## 2022-02-05 MED ORDER — FENTANYL CITRATE (PF) 100 MCG/2ML IJ SOLN
25.0000 ug | INTRAMUSCULAR | Status: DC | PRN
  Administered 2022-02-05: 50 ug via INTRAVENOUS

## 2022-02-05 MED ORDER — IBUPROFEN 600 MG PO TABS
600.0000 mg | ORAL_TABLET | Freq: Four times a day (QID) | ORAL | Status: DC
  Administered 2022-02-05 – 2022-02-07 (×8): 600 mg via ORAL
  Filled 2022-02-05 (×8): qty 1

## 2022-02-05 MED ORDER — ZOLPIDEM TARTRATE 5 MG PO TABS: 5.0000 mg | ORAL_TABLET | Freq: Every evening | ORAL | Status: DC | PRN

## 2022-02-05 MED ORDER — WITCH HAZEL-GLYCERIN EX PADS: 1.0000 | MEDICATED_PAD | CUTANEOUS | Status: DC | PRN

## 2022-02-05 MED ORDER — KETOROLAC TROMETHAMINE 30 MG/ML IJ SOLN: 30.0000 mg | Freq: Four times a day (QID) | INTRAMUSCULAR | Status: DC | PRN

## 2022-02-05 MED ORDER — DIBUCAINE (PERIANAL) 1 % EX OINT: 1.0000 | TOPICAL_OINTMENT | CUTANEOUS | Status: DC | PRN

## 2022-02-05 MED ORDER — PRENATAL MULTIVITAMIN CH
1.0000 | ORAL_TABLET | Freq: Every day | ORAL | Status: DC
  Administered 2022-02-06 – 2022-02-07 (×2): 1 via ORAL
  Filled 2022-02-05 (×3): qty 1

## 2022-02-05 MED ORDER — KETOROLAC TROMETHAMINE 30 MG/ML IJ SOLN
30.0000 mg | Freq: Four times a day (QID) | INTRAMUSCULAR | Status: DC | PRN
  Administered 2022-02-05: 30 mg via INTRAMUSCULAR

## 2022-02-05 MED ORDER — ONDANSETRON HCL 4 MG/2ML IJ SOLN: 4.0000 mg | Freq: Three times a day (TID) | INTRAMUSCULAR | Status: DC | PRN

## 2022-02-05 MED ORDER — PHENYLEPHRINE HCL-NACL 20-0.9 MG/250ML-% IV SOLN
INTRAVENOUS | Status: DC | PRN
  Administered 2022-02-05: 80 ug/min via INTRAVENOUS

## 2022-02-05 MED ORDER — DIPHENHYDRAMINE HCL 50 MG/ML IJ SOLN: 12.5000 mg | INTRAMUSCULAR | Status: DC | PRN

## 2022-02-05 MED ORDER — SIMETHICONE 80 MG PO CHEW
80.0000 mg | CHEWABLE_TABLET | Freq: Three times a day (TID) | ORAL | Status: DC
  Administered 2022-02-05 – 2022-02-07 (×7): 80 mg via ORAL
  Filled 2022-02-05 (×7): qty 1

## 2022-02-05 MED ORDER — SIMETHICONE 80 MG PO CHEW
80.0000 mg | CHEWABLE_TABLET | ORAL | Status: DC | PRN
  Administered 2022-02-06: 80 mg via ORAL
  Filled 2022-02-05: qty 1

## 2022-02-05 MED ORDER — SODIUM CHLORIDE 0.9 % IR SOLN
Status: DC | PRN
  Administered 2022-02-05: 1

## 2022-02-05 MED ORDER — COCONUT OIL OIL
1.0000 | TOPICAL_OIL | Status: DC | PRN
  Administered 2022-02-06: 1 via TOPICAL

## 2022-02-05 MED ORDER — MENTHOL 3 MG MT LOZG: 1.0000 | LOZENGE | OROMUCOSAL | Status: DC | PRN

## 2022-02-05 MED ORDER — CEFAZOLIN SODIUM-DEXTROSE 2-4 GM/100ML-% IV SOLN
2.0000 g | INTRAVENOUS | Status: AC
  Administered 2022-02-05: 2 g via INTRAVENOUS

## 2022-02-05 MED ORDER — BUPIVACAINE IN DEXTROSE 0.75-8.25 % IT SOLN
INTRATHECAL | Status: DC | PRN
  Administered 2022-02-05: 1.6 mL via INTRATHECAL

## 2022-02-05 MED ORDER — ACETAMINOPHEN 500 MG PO TABS
1000.0000 mg | ORAL_TABLET | Freq: Four times a day (QID) | ORAL | Status: AC
  Administered 2022-02-05 – 2022-02-06 (×3): 1000 mg via ORAL
  Filled 2022-02-05 (×3): qty 2

## 2022-02-05 MED ORDER — PHENYLEPHRINE HCL-NACL 20-0.9 MG/250ML-% IV SOLN
INTRAVENOUS | Status: AC
  Filled 2022-02-05: qty 250

## 2022-02-05 MED ORDER — FENTANYL CITRATE (PF) 100 MCG/2ML IJ SOLN
INTRAMUSCULAR | Status: AC
  Filled 2022-02-05: qty 2

## 2022-02-05 MED ORDER — DIPHENHYDRAMINE HCL 25 MG PO CAPS: 25.0000 mg | ORAL_CAPSULE | ORAL | Status: DC | PRN

## 2022-02-05 MED ORDER — DEXAMETHASONE SODIUM PHOSPHATE 10 MG/ML IJ SOLN
INTRAMUSCULAR | Status: DC | PRN
  Administered 2022-02-05: 10 mg via INTRAVENOUS

## 2022-02-05 MED ORDER — OXYCODONE HCL 5 MG PO TABS: 5.0000 mg | ORAL_TABLET | ORAL | Status: DC | PRN

## 2022-02-05 MED ORDER — KETOROLAC TROMETHAMINE 30 MG/ML IJ SOLN
INTRAMUSCULAR | Status: AC
  Filled 2022-02-05: qty 1

## 2022-02-05 MED ORDER — ACETAMINOPHEN 500 MG PO TABS: 1000.0000 mg | ORAL_TABLET | Freq: Once | ORAL | Status: DC

## 2022-02-05 MED ORDER — POVIDONE-IODINE 10 % EX SWAB: 2.0000 | Freq: Once | CUTANEOUS | Status: DC

## 2022-02-05 MED ORDER — DROPERIDOL 2.5 MG/ML IJ SOLN: 0.6250 mg | Freq: Once | INTRAMUSCULAR | Status: DC | PRN

## 2022-02-05 MED ORDER — ACETAMINOPHEN 325 MG PO TABS
650.0000 mg | ORAL_TABLET | ORAL | Status: DC | PRN
  Administered 2022-02-07: 650 mg via ORAL
  Filled 2022-02-05: qty 2

## 2022-02-05 MED ORDER — OXYTOCIN-SODIUM CHLORIDE 30-0.9 UT/500ML-% IV SOLN
2.5000 [IU]/h | INTRAVENOUS | Status: AC
  Administered 2022-02-05: 2.5 [IU]/h via INTRAVENOUS
  Filled 2022-02-05: qty 500

## 2022-02-05 MED ORDER — STERILE WATER FOR IRRIGATION IR SOLN
Status: DC | PRN
  Administered 2022-02-05: 1000 mL

## 2022-02-05 MED ORDER — TETANUS-DIPHTH-ACELL PERTUSSIS 5-2.5-18.5 LF-MCG/0.5 IM SUSY: 0.5000 mL | PREFILLED_SYRINGE | Freq: Once | INTRAMUSCULAR | Status: DC

## 2022-02-05 MED ORDER — FENTANYL CITRATE (PF) 100 MCG/2ML IJ SOLN
INTRAMUSCULAR | Status: DC | PRN
  Administered 2022-02-05: 15 ug via INTRATHECAL

## 2022-02-05 MED ORDER — KETOROLAC TROMETHAMINE 30 MG/ML IJ SOLN: 30.0000 mg | Freq: Once | INTRAMUSCULAR | Status: DC

## 2022-02-05 MED ORDER — SODIUM CHLORIDE 0.9% FLUSH: 3.0000 mL | INTRAVENOUS | Status: DC | PRN

## 2022-02-05 MED ORDER — ONDANSETRON HCL 4 MG/2ML IJ SOLN
INTRAMUSCULAR | Status: DC | PRN
  Administered 2022-02-05: 4 mg via INTRAVENOUS

## 2022-02-05 MED ORDER — DEXAMETHASONE SODIUM PHOSPHATE 10 MG/ML IJ SOLN
INTRAMUSCULAR | Status: AC
  Filled 2022-02-05: qty 1

## 2022-02-05 MED ORDER — ONDANSETRON HCL 4 MG/2ML IJ SOLN
INTRAMUSCULAR | Status: AC
  Filled 2022-02-05: qty 2

## 2022-02-05 SURGICAL SUPPLY — 31 items
BENZOIN TINCTURE PRP APPL 2/3 (GAUZE/BANDAGES/DRESSINGS) ×1 IMPLANT
CHLORAPREP W/TINT 26ML (MISCELLANEOUS) ×2 IMPLANT
CLAMP UMBILICAL CORD (MISCELLANEOUS) ×1 IMPLANT
CLOTH BEACON ORANGE TIMEOUT ST (SAFETY) ×1 IMPLANT
DERMABOND ADVANCED .7 DNX12 (GAUZE/BANDAGES/DRESSINGS) IMPLANT
DRSG OPSITE POSTOP 4X10 (GAUZE/BANDAGES/DRESSINGS) ×1 IMPLANT
ELECT REM PT RETURN 9FT ADLT (ELECTROSURGICAL) ×1
ELECTRODE REM PT RTRN 9FT ADLT (ELECTROSURGICAL) ×1 IMPLANT
EXTRACTOR VACUUM KIWI (MISCELLANEOUS) IMPLANT
GLOVE BIO SURGEON STRL SZ 6 (GLOVE) ×1 IMPLANT
GLOVE BIOGEL PI IND STRL 6 (GLOVE) ×2 IMPLANT
GLOVE BIOGEL PI IND STRL 7.0 (GLOVE) ×1 IMPLANT
GOWN STRL REUS W/TWL LRG LVL3 (GOWN DISPOSABLE) ×2 IMPLANT
KIT ABG SYR 3ML LUER SLIP (SYRINGE) ×1 IMPLANT
NDL HYPO 25X5/8 SAFETYGLIDE (NEEDLE) ×1 IMPLANT
NDL KEITH (NEEDLE) IMPLANT
NEEDLE HYPO 25X5/8 SAFETYGLIDE (NEEDLE) ×1 IMPLANT
NEEDLE KEITH (NEEDLE) ×1 IMPLANT
NS IRRIG 1000ML POUR BTL (IV SOLUTION) ×1 IMPLANT
PACK C SECTION WH (CUSTOM PROCEDURE TRAY) ×1 IMPLANT
PAD OB MATERNITY 4.3X12.25 (PERSONAL CARE ITEMS) ×1 IMPLANT
STRIP CLOSURE SKIN 1/2X4 (GAUZE/BANDAGES/DRESSINGS) IMPLANT
SUT CHROMIC 0 CTX 36 (SUTURE) ×3 IMPLANT
SUT MON AB 2-0 CT1 27 (SUTURE) ×1 IMPLANT
SUT PDS AB 0 CT1 27 (SUTURE) IMPLANT
SUT PLAIN 0 NONE (SUTURE) IMPLANT
SUT VIC AB 0 CT1 36 (SUTURE) IMPLANT
SUT VIC AB 4-0 KS 27 (SUTURE) IMPLANT
TOWEL OR 17X24 6PK STRL BLUE (TOWEL DISPOSABLE) ×1 IMPLANT
TRAY FOLEY W/BAG SLVR 14FR LF (SET/KITS/TRAYS/PACK) IMPLANT
WATER STERILE IRR 1000ML POUR (IV SOLUTION) ×1 IMPLANT

## 2022-02-05 NOTE — Anesthesia Postprocedure Evaluation (Signed)
Anesthesia Post Note  Patient: Sheena Moore  Procedure(s) Performed: PRIMARY CESAREAN SECTION EDC: 02-11-22  ALLERG: NKDA     Patient location during evaluation: PACU Anesthesia Type: Spinal Level of consciousness: awake and alert Pain management: pain level controlled Vital Signs Assessment: post-procedure vital signs reviewed and stable Respiratory status: spontaneous breathing, nonlabored ventilation and respiratory function stable Cardiovascular status: blood pressure returned to baseline Postop Assessment: no apparent nausea or vomiting, spinal receding, no headache and no backache Anesthetic complications: no   No notable events documented.  Last Vitals:  Vitals:   02/05/22 0945 02/05/22 1004  BP: 122/89 119/73  Pulse: 88 90  Resp: 20 20  Temp: 36.9 C 37.4 C  SpO2: 97% 98%    Last Pain:  Vitals:   02/05/22 1004  TempSrc: Axillary  PainSc: 0-No pain   Pain Goal: Patients Stated Pain Goal: 6 (02/05/22 0849)              Epidural/Spinal Function Cutaneous sensation: Tingles (02/05/22 1004), Patient able to flex knees: Yes (02/05/22 1004), Patient able to lift hips off bed: Yes (02/05/22 1004), Back pain beyond tenderness at insertion site: No (02/05/22 1004), Progressively worsening motor and/or sensory loss: No (02/05/22 1004), Bowel and/or bladder incontinence post epidural: No (02/05/22 1004)  Marthenia Rolling

## 2022-02-05 NOTE — Op Note (Signed)
Sheena Moore PROCEDURE DATE: 02/05/2022  PREOPERATIVE DIAGNOSIS: Intrauterine pregnancy at  [redacted]w[redacted]d weeks gestation, breech presentation  POSTOPERATIVE DIAGNOSIS: The same  PROCEDURE:  Primary Low Transverse Cesarean Section  SURGEON:  Dr. Linda Hedges  INDICATIONS: Sheena Moore is a 31 y.o. G1P0 at [redacted]w[redacted]d scheduled for cesarean section secondary to breech presentation.  The risks of cesarean section discussed with the patient included but were not limited to: bleeding which may require transfusion or reoperation; infection which may require antibiotics; injury to bowel, bladder, ureters or other surrounding organs; injury to the fetus; need for additional procedures including hysterectomy in the event of a life-threatening hemorrhage; placental abnormalities wth subsequent pregnancies, incisional problems, thromboembolic phenomenon and other postoperative/anesthesia complications. The patient concurred with the proposed plan, giving informed written consent for the procedure.    FINDINGS:  Viable female infant in breech presentation, APGARs per NICU: weight pending  Clear amniotic fluid.  Intact placenta, three vessel cord.  Grossly normal uterus, ovaries and fallopian tubes. .   ANESTHESIA:  Spinal ESTIMATED BLOOD LOSS: 725 mL ml SPECIMENS: Placenta sent to pathology for placental cyst COMPLICATIONS: None immediate  PROCEDURE IN DETAIL:  The patient received intravenous antibiotics and had sequential compression devices applied to her lower extremities while in the preoperative area.  She was then taken to the operating room where spinal anesthesia was administered and was found to be adequate. She was then placed in a dorsal supine position with a leftward tilt, and prepped and draped in a sterile manner.  A foley catheter was placed into her bladder and attached to constant gravity.  After an adequate timeout was performed, a Pfannenstiel skin incision was made with scalpel and  carried through to the underlying layer of fascia. The fascia was incised in the midline and this incision was extended bilaterally using the Mayo scissors. Kocher clamps were applied to the superior aspect of the fascial incision and the underlying rectus muscles were dissected off bluntly. A similar process was carried out on the inferior aspect of the facial incision. The rectus muscles were separated in the midline bluntly and the peritoneum was entered bluntly.   A transverse hysterotomy was made with a scalpel and extended bilaterally bluntly. The bladder blade was then removed. The infant was successfully delivered using typical breech maneuvers, and cord was clamped and cut and infant was handed over to awaiting neonatology team. Uterine massage was then administered and the placenta delivered intact with three-vessel cord. Approximately 3 cm sized cyst of placenta adjacent to umbilical cord insertion; membranes with clear fluid filled.  The uterus was cleared of clot and debris.  The hysterotomy was closed with 0 chromic.  A second imbricating suture of 0-chromic was used to reinforce the incision and aid in hemostasis.  The peritoneum and rectus muscles were noted to be hemostatic and were reapproximated using 2-0 monocryl in a running fashion.  The fascia was closed with 0-Vicryl in a running fashion with good restoration of anatomy.  The subcutaneus tissue was copiously irrigated.  The skin was closed with 4-0 vicryl in a subcuticular fashion.  Pt tolerated the procedure will.  All counts were correct x2.  Pt went to the recovery room in stable condition.

## 2022-02-05 NOTE — Progress Notes (Signed)
Bedside u/s done and head in RUQ.   No change to H&P.  Linda Hedges, DO

## 2022-02-05 NOTE — Transfer of Care (Signed)
Immediate Anesthesia Transfer of Care Note  Patient: Sheena Moore  Procedure(s) Performed: PRIMARY CESAREAN SECTION EDC: 02-11-22  ALLERG: NKDA  Patient Location: PACU  Anesthesia Type:Spinal  Level of Consciousness: awake, alert , oriented and patient cooperative  Airway & Oxygen Therapy: Patient Spontanous Breathing  Post-op Assessment: Report given to RN and Post -op Vital signs reviewed and stable  Post vital signs: Reviewed and stable  Last Vitals:  Vitals Value Taken Time  BP 114/49 02/05/22 0849  Temp    Pulse 85 02/05/22 0852  Resp 19 02/05/22 0852  SpO2 99 % 02/05/22 0852  Vitals shown include unvalidated device data.  Last Pain:  Vitals:   02/05/22 0607  TempSrc: Oral  PainSc: 0-No pain         Complications: No notable events documented.

## 2022-02-05 NOTE — Lactation Note (Signed)
This note was copied from a baby's chart. Lactation Consultation Note  Patient Name: Sheena Moore UXNAT'F Date: 02/05/2022 Reason for consult: Initial assessment;1st time breastfeeding;Primapara Age:29 hours  Parents had questions about bottle feeding and pumping.   LC addressed questions.  Parent states baby was very fussy after delivery.  Dad helps to hand express.   Baby attempted to latch but couldn't grasp the breast.  Prepump with manual.  Parts and cleaning explained to parents.   LC did suck training. Baby got into a rhythm and was switched to the right.  Baby latched to left side.  Good rhythmic sucking noted.  Parents encouraged to keep baby sts, feed 8-12 times in 24 hours, hand express and call out for assistance if needed. Resource sheet given.  Strongly encouraged follow up with [facility].  Mom is familiar with med center.    Maternal Data Has patient been taught Hand Expression?: Yes Does the patient have breastfeeding experience prior to this delivery?: No  Feeding Mother's Current Feeding Choice: Breast Milk  LATCH Score Latch: Repeated attempts needed to sustain latch, nipple held in mouth throughout feeding, stimulation needed to elicit sucking reflex. (multiple attempts before baby achieved latch)  Audible Swallowing: A few with stimulation  Type of Nipple: Everted at rest and after stimulation (shorter shaft)  Comfort (Breast/Nipple): Soft / non-tender  Hold (Positioning): Assistance needed to correctly position infant at breast and maintain latch.  LATCH Score: 7   Lactation Tools Discussed/Used Tools: Pump Breast pump type: Manual Pump Education: Milk Storage Reason for Pumping: pre pump  Interventions Interventions: Breast feeding basics reviewed;Skin to skin;Hand express;Support pillows;Adjust position;Breast compression;LC Services brochure  Discharge Discharge Education: Outpatient recommendation (encouraged parent to seek OP  LC support. [facility]commended [facility]; pt. went there for MFM.) Pump: Personal;Manual  Consult Status Consult Status: Follow-up Date: 02/06/22 Follow-up type: In-patient    Ferne Coe [facility] 02/05/2022, 3:30 PM

## 2022-02-05 NOTE — Anesthesia Procedure Notes (Signed)
Spinal  Patient location during procedure: OR Start time: 02/05/2022 7:30 AM End time: 02/05/2022 7:33 AM Reason for block: surgical anesthesia Staffing Performed: anesthesiologist  Anesthesiologist: Brennan Bailey, MD Performed by: Brennan Bailey, MD Authorized by: Brennan Bailey, MD   Preanesthetic Checklist Completed: patient identified, IV checked, risks and benefits discussed, monitors and equipment checked, pre-op evaluation and timeout performed Spinal Block Patient position: sitting Prep: DuraPrep and site prepped and draped Patient monitoring: heart rate, continuous pulse ox and blood pressure Approach: midline Location: L3-4 Injection technique: single-shot Needle Needle type: Pencan  Needle gauge: 24 G Needle length: 10 cm Assessment Sensory level: T4 Events: CSF return Additional Notes Risks, benefits, and alternative discussed. Patient gave consent to procedure. Prepped and draped in sitting position. Clear CSF obtained after one needle pass. Positive terminal aspiration. No pain or paraesthesias with injection. Patient tolerated procedure well. Vital signs stable. Tawny Asal, MD

## 2022-02-06 LAB — CBC
HCT: 25.5 % — ABNORMAL LOW (ref 36.0–46.0)
Hemoglobin: 9.1 g/dL — ABNORMAL LOW (ref 12.0–15.0)
MCH: 35.3 pg — ABNORMAL HIGH (ref 26.0–34.0)
MCHC: 35.7 g/dL (ref 30.0–36.0)
MCV: 98.8 fL (ref 80.0–100.0)
Platelets: 194 10*3/uL (ref 150–400)
RBC: 2.58 MIL/uL — ABNORMAL LOW (ref 3.87–5.11)
RDW: 12.6 % (ref 11.5–15.5)
WBC: 14.3 10*3/uL — ABNORMAL HIGH (ref 4.0–10.5)
nRBC: 0 % (ref 0.0–0.2)

## 2022-02-06 NOTE — Progress Notes (Signed)
Postop Progress Note  S: No complaints. Feeling well. Lochia appropriate. No subjective fevers/chills, Cp, or SOB. Ambulating.   O:  Vitals:   02/06/22 0045 02/06/22 0607  BP: 109/74 107/72  Pulse: 87 73  Resp: 18 18  Temp: 98.3 F (36.8 C) 98.5 F (36.9 C)  SpO2: 100% 100%    Gen: NAD, A&O Pulm: NWOB Abd: soft, appropriately ttp, fundus firm and below Umb. Incision c/d - pressure dressing removed, honeycomb dressing in place with minimal strike through Ext: No evidence of DVT, trace edema b/l  UOP adequate.   Labs Recent Results (from the past 2160 hour(s))  OB RESULT CONSOLE Group B Strep     Status: None   Collection Time: 01/22/22 12:00 AM  Result Value Ref Range   GBS Negative   Fern Test     Status: Normal   Collection Time: 02/02/22  8:30 AM  Result Value Ref Range   POCT Fern Test     Negative    Amnisure rupture of membrane (rom)not at [facility]     Status: None   Collection Time: 02/02/22  8:35 AM  Result Value Ref Range   Amnisure ROM NEGATIVE     Comment: Performed at [facility], [address] [place], [place] [address]  RPR     Status: None   Collection Time: 02/03/22  9:06 AM  Result Value Ref Range   RPR Ser Ql NON REACTIVE NON REACTIVE    Comment: Performed at [facility], [address], [place], [place] [address]  CBC     Status: Abnormal   Collection Time: 02/03/22  9:06 AM  Result Value Ref Range   WBC 7.7 4.0 - 10.5 K/uL   RBC 3.62 (L) 3.87 - 5.11 MIL/uL   Hemoglobin 12.2 12.0 - 15.0 g/dL   HCT 95.2 84.1 - 32.4 %   MCV 101.9 (H) 80.0 - 100.0 fL   MCH 33.7 26.0 - 34.0 pg   MCHC 33.1 30.0 - 36.0 g/dL   RDW 40.1 02.7 - 25.3 %   Platelets 197 150 - 400 K/uL   nRBC 0.0 0.0 - 0.2 %    Comment: Performed at [facility], [address], [place], [place] [address]  Type and screen [facility]     Status: None   Collection Time: 02/03/22  9:10 AM  Result Value Ref Range   ABO/RH(D) A NEG    Antibody Screen  POS    Sample Expiration 02/06/2022,2359    Antibody Identification      PASSIVELY ACQUIRED ANTI-D Performed at [facility], [address] [place], [place] [address]   ABO/Rh     Status: None   Collection Time: 02/05/22  6:00 AM  Result Value Ref Range   ABO/RH(D)      A NEG Performed at [facility], [address] [place], [place] [address]   CBC     Status: Abnormal   Collection Time: 02/06/22  5:41 AM  Result Value Ref Range   WBC 14.3 (H) 4.0 - 10.5 K/uL   RBC 2.58 (L) 3.87 - 5.11 MIL/uL   Hemoglobin 9.1 (L) 12.0 - 15.0 g/dL   HCT 87.5 (L) 64.3 - 32.9 %   MCV 98.8 80.0 - 100.0 fL   MCH 35.3 (H) 26.0 - 34.0 pg   MCHC 35.7 30.0 - 36.0 g/dL   RDW 51.8 84.1 - 66.0 %   Platelets 194 150 -  400 K/uL   nRBC 0.0 0.0 - 0.2 %    Comment: Performed at [facility], [place] [address], [place], [place] [address]     A/P:  POD1 s/p pCS, doing well postop. AFVSS. Benign exam. Continue iron supp via PNV Preg complicated by placental cyst Continue present care.  Plan for d/c POD#2 or 3.   Hurshel Party, MD

## 2022-02-07 MED ORDER — SENNOSIDES-DOCUSATE SODIUM 8.6-50 MG PO TABS: 2.0000 | ORAL_TABLET | Freq: Every day | ORAL | 1 refills | Status: DC

## 2022-02-07 MED ORDER — OXYCODONE HCL 5 MG PO TABS: 5.0000 mg | ORAL_TABLET | ORAL | 0 refills | Status: DC | PRN

## 2022-02-07 MED ORDER — IBUPROFEN 600 MG PO TABS: 600.0000 mg | ORAL_TABLET | Freq: Four times a day (QID) | ORAL | 0 refills | Status: DC

## 2022-02-07 MED ORDER — ACETAMINOPHEN 325 MG PO TABS: 650.0000 mg | ORAL_TABLET | ORAL | 0 refills | Status: DC | PRN

## 2022-02-07 MED ORDER — SIMETHICONE 80 MG PO CHEW: 80.0000 mg | CHEWABLE_TABLET | Freq: Three times a day (TID) | ORAL | 0 refills | Status: DC

## 2022-02-07 NOTE — Discharge Summary (Signed)
Postpartum Discharge Summary    Patient Name: Sheena Moore DOB: [birth year] MRN: [id number]  Date of admission: 02/05/2022 Delivery date:02/05/2022  Delivering provider: Linda Hedges  Date of discharge: 02/07/2022  Admitting diagnosis: S/P cesarean section [Z98.891] Breech presentation [O32.1XX0] Intrauterine pregnancy: [redacted]w[redacted]d     Secondary diagnosis:  Principal Problem:   S/P cesarean section Active Problems:   Breech presentation  Additional problems: placental cyst    Discharge diagnosis: Term Pregnancy Delivered and Anemia acute blood loss                                            Post partum procedures:none Augmentation: N/A Complications: None  Hospital course: Sceduled C/S   31 y.o. yo G1P1001 at [redacted]w[redacted]d was admitted to the hospital 02/05/2022 for scheduled cesarean section with the following indication:Malpresentation.Delivery details are as follows:  Membrane Rupture Time/Date: 7:57 AM ,02/05/2022   Delivery Method:C-Section, Low Transverse  Details of operation can be found in separate operative note.  Patient had an uncomplicated postpartum course.  She is ambulating, tolerating a regular diet, passing flatus, and urinating well. Patient is discharged home in stable condition on  02/07/22        Newborn Data: Birth date:02/05/2022  Birth time:7:58 AM  Gender:Female  Living status:Living  Apgars:8 ,9  Weight:3940 g     Magnesium Sulfate received: No BMZ received: No Rhophylac:No baby also Rh neg MMR:Yes T-DaP:Given prenatally Flu: No Transfusion:No  Physical exam  Vitals:   02/06/22 0607 02/06/22 1450 02/06/22 1959 02/07/22 0547  BP: 107/72 107/76 112/72 122/78  Pulse: 73 92 89 83  Resp: $Remo'18 18 18 18  'HxXAV$ Temp: 98.5 F (36.9 C) 98 F (36.7 C) 98.2 F (36.8 C) 98.1 F (36.7 C)  TempSrc: Oral Oral  Oral  SpO2: 100% 100% 99% 99%  Weight:      Height:       General: alert, cooperative, and no distress Lochia: appropriate Uterine Fundus:  firm Incision: Healing well with no significant drainage DVT Evaluation: No evidence of DVT seen on physical exam. Labs: Lab Results  Component Value Date   WBC 14.3 (H) 02/06/2022   HGB 9.1 (L) 02/06/2022   HCT 25.5 (L) 02/06/2022   MCV 98.8 02/06/2022   PLT 194 02/06/2022       No data to display         [place] Score:    02/05/2022   10:22 AM  Edinburgh Postnatal Depression Scale Screening Tool  I have been able to laugh and see the funny side of things. 0  I have looked forward with enjoyment to things. 0  I have blamed myself unnecessarily when things went wrong. 1  I have been anxious or worried for no good reason. 1  I have felt scared or panicky for no good reason. 0  Things have been getting on top of me. 0  I have been so unhappy that I have had difficulty sleeping. 0  I have felt sad or miserable. 0  I have been so unhappy that I have been crying. 0  The thought of harming myself has occurred to me. 0  Edinburgh Postnatal Depression Scale Total 2      After visit meds:  Allergies as of 02/07/2022   No Known Allergies      Medication List     TAKE these medications  acetaminophen 325 MG tablet Commonly known as: TYLENOL Take 2 tablets (650 mg total) by mouth every 4 (four) hours as needed for mild pain (temperature > 101.5.).   calcium carbonate 500 MG chewable tablet Commonly known as: TUMS - dosed in mg elemental calcium Chew 1 tablet by mouth at bedtime.   famotidine 20 MG tablet Commonly known as: PEPCID Take 20 mg by mouth at bedtime.   ibuprofen 600 MG tablet Commonly known as: ADVIL Take 1 tablet (600 mg total) by mouth 4 (four) times daily.   oxyCODONE 5 MG immediate release tablet Commonly known as: Oxy IR/ROXICODONE Take 1 tablet (5 mg total) by mouth every 4 (four) hours as needed for moderate pain.   prenatal multivitamin Tabs tablet Take 1 tablet by mouth at bedtime.   senna-docusate 8.6-50 MG tablet Commonly known as:  Senokot-S Take 2 tablets by mouth daily.   simethicone 80 MG chewable tablet Commonly known as: MYLICON Chew 1 tablet (80 mg total) by mouth 3 (three) times daily after meals.         Discharge home in stable condition Infant Feeding: Bottle and Breast Infant Disposition:home with mother Discharge instruction: per After Visit Summary and Postpartum booklet. Activity: Advance as tolerated. Pelvic rest for 6 weeks.  Diet: routine diet Anticipated Birth Control: Unsure Postpartum Appointment:6 weeks Additional Postpartum F/U: none  02/07/2022 Charlotta Newton, MD

## 2022-02-09 LAB — SURGICAL PATHOLOGY

## 2022-02-14 ENCOUNTER — Telehealth (HOSPITAL_COMMUNITY): Payer: Self-pay | Admitting: *Deleted

## 2022-02-14 NOTE — Telephone Encounter (Signed)
Patient voiced no questions or concerns regarding her health at this time. EPDS=5. Patient voiced no questions or concerns regarding infant at this time. Patient reports infant sleeps in a bassinet on her back. RN reviewed ABCs of safe sleep. Patient verbalized understanding. Patient requested RN email information on hospital's virtual postpartum classes and support groups. Email sent. Erline Levine, RN, 02/14/22, [phone]

## 2023-04-27 LAB — OB RESULTS CONSOLE RPR: RPR: NONREACTIVE

## 2023-04-27 LAB — OB RESULTS CONSOLE HEPATITIS B SURFACE ANTIGEN: Hepatitis B Surface Ag: NEGATIVE

## 2023-04-27 LAB — OB RESULTS CONSOLE HIV ANTIBODY (ROUTINE TESTING): HIV: NONREACTIVE

## 2023-04-27 LAB — OB RESULTS CONSOLE RUBELLA ANTIBODY, IGM: Rubella: IMMUNE

## 2023-04-27 LAB — HEPATITIS C ANTIBODY: HCV Ab: NEGATIVE

## 2023-04-27 LAB — OB RESULTS CONSOLE ANTIBODY SCREEN: Antibody Screen: NEGATIVE

## 2023-05-03 LAB — OB RESULTS CONSOLE GC/CHLAMYDIA
Chlamydia: NEGATIVE
Neisseria Gonorrhea: NEGATIVE

## 2023-08-18 ENCOUNTER — Ambulatory Visit
Admission: EM | Admit: 2023-08-18 | Discharge: 2023-08-18 | Disposition: A | Discharge status: Home / Self Care | Attending: Nurse Practitioner | Admitting: Nurse Practitioner

## 2023-08-18 DIAGNOSIS — B9689 Other specified bacterial agents as the cause of diseases classified elsewhere: Secondary | ICD-10-CM | POA: Diagnosis not present

## 2023-08-18 DIAGNOSIS — J019 Acute sinusitis, unspecified: Secondary | ICD-10-CM

## 2023-08-18 MED ORDER — AMOXICILLIN-POT CLAVULANATE 875-125 MG PO TABS: 1.0000 | ORAL_TABLET | Freq: Two times a day (BID) | ORAL | 0 refills | Status: AC

## 2023-08-18 NOTE — Discharge Instructions (Addendum)
 You a sinus infection.  If the redness in the right side of your cheek is not much better in 24-48 hours, start the Augmentin.  In the meantime, take the guaifenesin 600 mg twice daily and start normal saline spray.  Symptoms should improve over the next week to 10 days.  If you develop chest pain or shortness of breath, go to the emergency room.  Some things that can make you feel better are: - Increased rest - Increasing fluid with water/sugar free electrolytes - Acetaminophen as needed for fever/pain - Salt water gargling, chloraseptic spray and throat lozenges - OTC guaifenesin (Mucinex) 600 mg twice daily for congestion - Saline sinus flushes or a neti pot - Humidifying the air

## 2023-08-18 NOTE — ED Triage Notes (Addendum)
 Pt reports cough, congestion, headache, green mucus, right cheek is warm to the touch and has redness. Swelling and tednerness to lymph node on the left side of the neck. Ongoing since Thursday of last week. Pt is [redacted] weeks pregnant.

## 2023-08-18 NOTE — ED Provider Notes (Signed)
 [facility]    CSN: [id number] Arrival date & time: 08/18/23  0803      History   Chief Complaint No chief complaint on file.   HPI Sheena Moore is a 33 y.o. female.   Patient presents today with 6-day history of congested cough in the morning with thick, green mucus, runny and stuffy nose, swollen lymph node in the left side of her neck, sore throat that is now largely improved, right cheek warmth and redness, right eye redness.  Reports over the past few days, symptoms have improved dramatically.  She was exposed to her mother and daughter who are sick with viral symptoms last week.  She performed COVID-19 and influenza testing at home that was negative.  She denies fever, body aches or chills, shortness of breath or chest pain, sinus pressure, headache, ear pain, abdominal pain, nausea/vomiting, diarrhea, change in appetite, and fatigue.  Has not taken anything for symptoms so far.  She is approximately [redacted] weeks pregnant and since symptoms have been improving, she has not wanted to take anything.  She became concerned about the right cheek and eye redness last night.    Past Medical History:  Diagnosis Date   Medical history non-contributory     Patient Active Problem List   Diagnosis Date Noted   S/P cesarean section 02/05/2022   Breech presentation 02/05/2022    Past Surgical History:  Procedure Laterality Date   CESAREAN SECTION N/A 02/05/2022   Procedure: PRIMARY CESAREAN SECTION EDC: 02-11-22  ALLERG: NKDA;  Surgeon: Mitchel Honour, DO;  Location: [facility];  Service: Obstetrics;  Laterality: N/A;   WISDOM TOOTH EXTRACTION      OB History     Gravida  2   Para  1   Term  1   Preterm      AB      Living  1      SAB      IAB      Ectopic      Multiple  0   Live Births  1            Home Medications    Prior to Admission medications   Medication Sig Start Date End Date Taking? Authorizing Provider   amoxicillin-clavulanate (AUGMENTIN) 875-125 MG tablet Take 1 tablet by mouth 2 (two) times daily for 7 days. 08/18/23 08/25/23 Yes Valentino Nose, NP  acetaminophen (TYLENOL) 325 MG tablet Take 2 tablets (650 mg total) by mouth every 4 (four) hours as needed for mild pain (temperature > 101.5.). 02/07/22   Tawni Levy, MD  calcium carbonate (TUMS - DOSED IN MG ELEMENTAL CALCIUM) 500 MG chewable tablet Chew 1 tablet by mouth at bedtime.    [provider]  famotidine (PEPCID) 20 MG tablet Take 20 mg by mouth at bedtime.    [provider]  ibuprofen (ADVIL) 600 MG tablet Take 1 tablet (600 mg total) by mouth 4 (four) times daily. 02/07/22   Tawni Levy, MD  oxyCODONE (OXY IR/ROXICODONE) 5 MG immediate release tablet Take 1 tablet (5 mg total) by mouth every 4 (four) hours as needed for moderate pain. 02/07/22   Tawni Levy, MD  Prenatal Vit-Fe Fumarate-FA (PRENATAL MULTIVITAMIN) TABS tablet Take 1 tablet by mouth at bedtime.    [provider]  senna-docusate (SENOKOT-S) 8.6-50 MG tablet Take 2 tablets by mouth daily. 02/07/22   Tawni Levy, MD  simethicone (MYLICON) 80 MG chewable tablet Chew 1  tablet (80 mg total) by mouth 3 (three) times daily after meals. 02/07/22   Tawni Levy, MD    Family History Family History  Problem Relation Age of Onset   Diabetes Father    Stroke Maternal Grandmother    Asthma Neg Hx    Cancer Neg Hx    Heart disease Neg Hx    Intellectual disability Neg Hx     Social History Social History   Tobacco Use   Smoking status: Never   Smokeless tobacco: Never  Vaping Use   Vaping status: Never Used  Substance Use Topics   Alcohol use: Never   Drug use: Never     Allergies   Patient has no known allergies.   Review of Systems Review of Systems Per HPI  Physical Exam Triage Vital Signs ED Triage Vitals  Encounter Vitals Group     BP 08/18/23 0821 131/83     Systolic BP Percentile --      Diastolic BP Percentile --       Pulse Rate 08/18/23 0821 (!) 114     Resp 08/18/23 0821 18     Temp 08/18/23 0821 98.5 F (36.9 C)     Temp Source 08/18/23 0821 Oral     SpO2 08/18/23 0821 97 %     Weight --      Height --      Head Circumference --      Peak Flow --      Pain Score 08/18/23 0824 0     Pain Loc --      Pain Education --      Exclude from Growth Chart --    No data found.  Updated Vital Signs BP 131/83 (BP Location: Right Arm)   Pulse (!) 114   Temp 98.5 F (36.9 C) (Oral)   Resp 18   SpO2 97%   Breastfeeding No   118/81  Visual Acuity Right Eye Distance:   Left Eye Distance:   Bilateral Distance:    Right Eye Near:   Left Eye Near:    Bilateral Near:     Physical Exam Vitals and nursing note reviewed.  Constitutional:      General: She is not in acute distress.    Appearance: Normal appearance. She is not ill-appearing or toxic-appearing.  HENT:     Head: Normocephalic and atraumatic.     Right Ear: Tympanic membrane, ear canal and external ear normal.     Left Ear: Tympanic membrane, ear canal and external ear normal.     Nose: Rhinorrhea present. No congestion.     Right Sinus: No maxillary sinus tenderness or frontal sinus tenderness.     Left Sinus: No maxillary sinus tenderness or frontal sinus tenderness.     Comments: Right cheek slightly more erythematous than left cheek; no tenderness to palpation    Mouth/Throat:     Mouth: Mucous membranes are moist.     Pharynx: Oropharynx is clear. Posterior oropharyngeal erythema present. No oropharyngeal exudate.  Eyes:     General: No scleral icterus.    Extraocular Movements: Extraocular movements intact.     Comments: Right conjunctiva slightly erythematous  Cardiovascular:     Rate and Rhythm: Normal rate and regular rhythm.  Pulmonary:     Effort: Pulmonary effort is normal. No respiratory distress.     Breath sounds: Normal breath sounds. No wheezing, rhonchi or rales.  Musculoskeletal:     Cervical back:  Normal range of motion  and neck supple.  Lymphadenopathy:     Cervical: Cervical adenopathy present.  Skin:    General: Skin is warm and dry.     Capillary Refill: Capillary refill takes less than 2 seconds.     Coloration: Skin is not jaundiced or pale.     Findings: No erythema or rash.  Neurological:     Mental Status: She is alert and oriented to person, place, and time.     Motor: No weakness.  Psychiatric:        Behavior: Behavior is cooperative.      UC Treatments / Results  Labs (all labs ordered are listed, but only abnormal results are displayed) Labs Reviewed - No data to display  EKG   Radiology No results found.  Procedures Procedures (including critical care time)  Medications Ordered in UC Medications - No data to display  Initial Impression / Assessment and Plan / UC Course  I have reviewed the triage vital signs and the nursing notes.  Pertinent labs & imaging results that were available during my care of the patient were reviewed by me and considered in my medical decision making (see chart for details).   Patient is well-appearing, normotensive, afebrile, not tachycardic, not tachypneic, oxygenating well on room air.    1. Acute bacterial sinusitis Suspect viral versus early bacterial sinus infection Recommended starting guaifenesin, nasal saline spray the next 24 to 48 hours, if symptoms not significantly improved or if they worsen, start Augmentin Return and ER precautions discussed with patient Work excuse provided  The patient was given the opportunity to ask questions.  All questions answered to their satisfaction.  The patient is in agreement to this plan.   Final Clinical Impressions(s) / UC Diagnoses   Final diagnoses:  Acute bacterial sinusitis     Discharge Instructions      You a sinus infection.  If the redness in the right side of your cheek is not much better in 24-48 hours, start the Augmentin.  In the meantime, take the  guaifenesin 600 mg twice daily and start normal saline spray.  Symptoms should improve over the next week to 10 days.  If you develop chest pain or shortness of breath, go to the emergency room.  Some things that can make you feel better are: - Increased rest - Increasing fluid with water/sugar free electrolytes - Acetaminophen as needed for fever/pain - Salt water gargling, chloraseptic spray and throat lozenges - OTC guaifenesin (Mucinex) 600 mg twice daily for congestion - Saline sinus flushes or a neti pot - Humidifying the air     ED Prescriptions     Medication Sig Dispense Auth. Provider   amoxicillin-clavulanate (AUGMENTIN) 875-125 MG tablet Take 1 tablet by mouth 2 (two) times daily for 7 days. 14 tablet Valentino Nose, NP      PDMP not reviewed this encounter.   Valentino Nose, NP 08/18/23 [phone]

## 2023-11-08 LAB — OB RESULTS CONSOLE GBS: GBS: NEGATIVE

## 2023-11-09 NOTE — Patient Instructions (Signed)
 Sheena Moore  11/09/2023   Your procedure is scheduled on:  11/18/2023  Arrive at 0530 at Entrance C on [address] at [facility]  and [employer]. You are invited to use the FREE valet parking or use the Visitor's parking deck.  Pick up the phone at the desk and dial [phone].  Call this number if you have problems the morning of surgery: [phone]  Remember:   Do not eat food:(After Midnight) Desps de medianoche.  You may drink clear liquids until  __0330___.  Clear liquids means a liquid you can see thru.  It can have color such as Cola or Kool aid.  Tea is OK and coffee as long as no milk or creamer of any kind.  Take these medicines the morning of surgery with A SIP OF WATER :  none   Do not wear jewelry, make-up or nail polish.  Do not wear lotions, powders, or perfumes. Do not wear deodorant.  Do not shave 48 hours prior to surgery.  Do not bring valuables to the hospital.  [facility] is not   responsible for any belongings or valuables brought to the hospital.  Contacts, dentures or bridgework may not be worn into surgery.  Leave suitcase in the car. After surgery it may be brought to your room.  For patients admitted to the hospital, checkout time is 11:00 AM the day of              discharge.      Please read over the following fact sheets that you were given:     Preparing for Surgery

## 2023-11-10 ENCOUNTER — Encounter (HOSPITAL_COMMUNITY): Payer: Self-pay

## 2023-11-10 ENCOUNTER — Telehealth (HOSPITAL_COMMUNITY): Payer: Self-pay | Admitting: *Deleted

## 2023-11-10 NOTE — Telephone Encounter (Signed)
 Preadmission screen

## 2023-11-16 ENCOUNTER — Encounter (HOSPITAL_COMMUNITY)
Admission: RE | Admit: 2023-11-16 | Discharge: 2023-11-16 | Disposition: A | Discharge status: Home / Self Care | Source: Ambulatory Visit | Attending: Obstetrics and Gynecology | Admitting: Obstetrics and Gynecology

## 2023-11-16 DIAGNOSIS — Z01812 Encounter for preprocedural laboratory examination: Secondary | ICD-10-CM | POA: Insufficient documentation

## 2023-11-16 DIAGNOSIS — Z98891 History of uterine scar from previous surgery: Secondary | ICD-10-CM | POA: Insufficient documentation

## 2023-11-16 LAB — CBC
HCT: 36.4 % (ref 36.0–46.0)
Hemoglobin: 12.1 g/dL (ref 12.0–15.0)
MCH: 32.7 pg (ref 26.0–34.0)
MCHC: 33.2 g/dL (ref 30.0–36.0)
MCV: 98.4 fL (ref 80.0–100.0)
Platelets: 245 K/uL (ref 150–400)
RBC: 3.7 MIL/uL — ABNORMAL LOW (ref 3.87–5.11)
RDW: 13.2 % (ref 11.5–15.5)
WBC: 9.3 K/uL (ref 4.0–10.5)
nRBC: 0 % (ref 0.0–0.2)

## 2023-11-16 LAB — TYPE AND SCREEN
ABO/RH(D): A NEG
Antibody Screen: POSITIVE

## 2023-11-17 LAB — RPR: RPR Ser Ql: NONREACTIVE

## 2023-11-17 NOTE — H&P (Signed)
 Sheena Moore is a 33 y.o. female presenting for repeat cesarean section. Pregnancy complicated by history of cesarean section for breech. She desires repeat. OB History     Gravida  2   Para  1   Term  1   Preterm      AB      Living  1      SAB      IAB      Ectopic      Multiple  0   Live Births  1          Past Medical History:  Diagnosis Date   Medical history non-contributory    Past Surgical History:  Procedure Laterality Date   CESAREAN SECTION N/A 02/05/2022   Procedure: PRIMARY CESAREAN SECTION EDC: 02-11-22  ALLERG: NKDA;  Surgeon: Dannielle Bouchard, DO;  Location: [facility]S;  Service: Obstetrics;  Laterality: N/A;   WISDOM TOOTH EXTRACTION     Family History: family history includes Diabetes in her father; Stroke in her maternal grandmother. Social History:  reports that she has never smoked. She has never used smokeless tobacco. She reports that she does not drink alcohol and does not use drugs.     Maternal Diabetes: No Genetic Screening: Normal Maternal Ultrasounds/Referrals: Normal Fetal Ultrasounds or other Referrals:  None Maternal Substance Abuse:  No Significant Maternal Medications:  None Significant Maternal Lab Results:  Group B Strep negative Number of Prenatal Visits:greater than 3 verified prenatal visits Maternal Vaccinations: Other Comments:  None  Review of Systems Maternal Medical History:  Fetal activity: Perceived fetal activity is normal.       not currently breastfeeding. Maternal Exam:  Abdomen: Fetal presentation: vertex   Physical Exam Cardiovascular:     Rate and Rhythm: Normal rate.  Pulmonary:     Effort: Pulmonary effort is normal.     Prenatal labs: ABO, Rh: --/--/A NEG (07/08 0911) Antibody: POS (07/08 0911) Rubella: Immune (12/17 0000) RPR: NON REACTIVE (07/08 0930)  HBsAg: Negative (12/17 0000)  HIV: Non-reactive (12/17 0000)  GBS: Negative/-- (06/30 0000)   Assessment/Plan: 33 yo G2P1  @ 39 0/7 wks for repeat cesarean section. Risks have been discussed including infection, organ damage, bleeding/transfusion-HIV/Hep, DVT/PE, pneumonia, wound breakdown. She states she understands and agrees.   Sheena Moore E Sheena Moore 11/17/2023, 5:06 PM

## 2023-11-18 ENCOUNTER — Inpatient Hospital Stay (HOSPITAL_COMMUNITY)
Admission: AD | Admit: 2023-11-18 | Discharge: 2023-11-20 | DRG: 788 | Disposition: A | Discharge status: Home / Self Care | Attending: Obstetrics and Gynecology | Admitting: Obstetrics and Gynecology

## 2023-11-18 ENCOUNTER — Encounter (HOSPITAL_COMMUNITY): Payer: Self-pay | Admitting: Anesthesiology

## 2023-11-18 ENCOUNTER — Encounter (HOSPITAL_COMMUNITY): Payer: Self-pay | Admitting: Obstetrics and Gynecology

## 2023-11-18 ENCOUNTER — Other Ambulatory Visit: Payer: Self-pay

## 2023-11-18 ENCOUNTER — Encounter (HOSPITAL_COMMUNITY)
Admission: AD | Disposition: A | Discharge status: Home / Self Care | Payer: Self-pay | Source: Home / Self Care | Attending: Obstetrics and Gynecology

## 2023-11-18 ENCOUNTER — Inpatient Hospital Stay (HOSPITAL_COMMUNITY): Payer: Self-pay | Admitting: Anesthesiology

## 2023-11-18 DIAGNOSIS — Z3A39 39 weeks gestation of pregnancy: Secondary | ICD-10-CM

## 2023-11-18 DIAGNOSIS — O34211 Maternal care for low transverse scar from previous cesarean delivery: Secondary | ICD-10-CM

## 2023-11-18 DIAGNOSIS — Z98891 History of uterine scar from previous surgery: Principal | ICD-10-CM

## 2023-11-18 DIAGNOSIS — Z3490 Encounter for supervision of normal pregnancy, unspecified, unspecified trimester: Secondary | ICD-10-CM

## 2023-11-18 DIAGNOSIS — Z833 Family history of diabetes mellitus: Secondary | ICD-10-CM

## 2023-11-18 SURGERY — Surgical Case
Anesthesia: Spinal

## 2023-11-18 MED ORDER — MORPHINE SULFATE (PF) 0.5 MG/ML IJ SOLN
INTRAMUSCULAR | Status: AC
  Filled 2023-11-18: qty 10

## 2023-11-18 MED ORDER — STERILE WATER FOR IRRIGATION IR SOLN
Status: DC | PRN
  Administered 2023-11-18: 1000 mL

## 2023-11-18 MED ORDER — SENNOSIDES-DOCUSATE SODIUM 8.6-50 MG PO TABS
2.0000 | ORAL_TABLET | Freq: Every day | ORAL | Status: DC
  Administered 2023-11-19 – 2023-11-20 (×2): 2 via ORAL
  Filled 2023-11-18 (×2): qty 2

## 2023-11-18 MED ORDER — KETOROLAC TROMETHAMINE 30 MG/ML IJ SOLN
INTRAMUSCULAR | Status: AC
  Filled 2023-11-18: qty 1

## 2023-11-18 MED ORDER — SIMETHICONE 80 MG PO CHEW: 80.0000 mg | CHEWABLE_TABLET | ORAL | Status: DC | PRN

## 2023-11-18 MED ORDER — FENTANYL CITRATE (PF) 100 MCG/2ML IJ SOLN
INTRAMUSCULAR | Status: DC | PRN
  Administered 2023-11-18: 15 ug via INTRATHECAL

## 2023-11-18 MED ORDER — TRANEXAMIC ACID-NACL 1000-0.7 MG/100ML-% IV SOLN
1000.0000 mg | Freq: Once | INTRAVENOUS | Status: AC
  Administered 2023-11-18: 1000 mg via INTRAVENOUS

## 2023-11-18 MED ORDER — CEFAZOLIN SODIUM-DEXTROSE 2-4 GM/100ML-% IV SOLN
INTRAVENOUS | Status: AC
  Filled 2023-11-18: qty 100

## 2023-11-18 MED ORDER — SIMETHICONE 80 MG PO CHEW
80.0000 mg | CHEWABLE_TABLET | Freq: Three times a day (TID) | ORAL | Status: DC
  Administered 2023-11-18 – 2023-11-20 (×7): 80 mg via ORAL
  Filled 2023-11-18 (×7): qty 1

## 2023-11-18 MED ORDER — WITCH HAZEL-GLYCERIN EX PADS: 1.0000 | MEDICATED_PAD | CUTANEOUS | Status: DC | PRN

## 2023-11-18 MED ORDER — DEXAMETHASONE SODIUM PHOSPHATE 10 MG/ML IJ SOLN
INTRAMUSCULAR | Status: DC | PRN
  Administered 2023-11-18: 10 mg via INTRAVENOUS

## 2023-11-18 MED ORDER — CEFAZOLIN SODIUM-DEXTROSE 2-4 GM/100ML-% IV SOLN
2.0000 g | INTRAVENOUS | Status: AC
  Administered 2023-11-18: 2 g via INTRAVENOUS

## 2023-11-18 MED ORDER — OXYCODONE HCL 5 MG PO TABS
5.0000 mg | ORAL_TABLET | ORAL | Status: DC | PRN
  Administered 2023-11-20 (×2): 5 mg via ORAL
  Filled 2023-11-18 (×2): qty 1

## 2023-11-18 MED ORDER — ACETAMINOPHEN 10 MG/ML IV SOLN
INTRAVENOUS | Status: DC | PRN
  Administered 2023-11-18: 1000 mg via INTRAVENOUS

## 2023-11-18 MED ORDER — OXYTOCIN-SODIUM CHLORIDE 30-0.9 UT/500ML-% IV SOLN: 2.5000 [IU]/h | INTRAVENOUS | Status: DC

## 2023-11-18 MED ORDER — RHO D IMMUNE GLOBULIN 1500 UNIT/2ML IJ SOSY
300.0000 ug | PREFILLED_SYRINGE | Freq: Once | INTRAMUSCULAR | Status: AC
  Administered 2023-11-19: 300 ug via INTRAVENOUS
  Filled 2023-11-18: qty 2

## 2023-11-18 MED ORDER — ONDANSETRON HCL 4 MG/2ML IJ SOLN
INTRAMUSCULAR | Status: DC | PRN
Start: 2023-11-18 — End: 2023-11-18
  Administered 2023-11-18: 4 mg via INTRAVENOUS

## 2023-11-18 MED ORDER — KETOROLAC TROMETHAMINE 30 MG/ML IJ SOLN
30.0000 mg | Freq: Four times a day (QID) | INTRAMUSCULAR | Status: AC
  Administered 2023-11-18 – 2023-11-19 (×4): 30 mg via INTRAVENOUS
  Filled 2023-11-18 (×3): qty 1

## 2023-11-18 MED ORDER — DIPHENHYDRAMINE HCL 50 MG/ML IJ SOLN: 12.5000 mg | INTRAMUSCULAR | Status: DC | PRN

## 2023-11-18 MED ORDER — HYDROMORPHONE HCL 1 MG/ML IJ SOLN: 0.2000 mg | INTRAMUSCULAR | Status: DC | PRN

## 2023-11-18 MED ORDER — OXYTOCIN-SODIUM CHLORIDE 30-0.9 UT/500ML-% IV SOLN
INTRAVENOUS | Status: DC | PRN
  Administered 2023-11-18: 250 mL via INTRAVENOUS

## 2023-11-18 MED ORDER — MORPHINE SULFATE (PF) 0.5 MG/ML IJ SOLN
INTRAMUSCULAR | Status: DC | PRN
  Administered 2023-11-18: 150 ug via INTRATHECAL

## 2023-11-18 MED ORDER — ACETAMINOPHEN 500 MG PO TABS: 1000.0000 mg | ORAL_TABLET | Freq: Four times a day (QID) | ORAL | Status: DC

## 2023-11-18 MED ORDER — DIPHENHYDRAMINE HCL 25 MG PO CAPS: 25.0000 mg | ORAL_CAPSULE | ORAL | Status: DC | PRN

## 2023-11-18 MED ORDER — SODIUM CHLORIDE 0.9% FLUSH: 3.0000 mL | INTRAVENOUS | Status: DC | PRN

## 2023-11-18 MED ORDER — SOD CITRATE-CITRIC ACID 500-334 MG/5ML PO SOLN: 30.0000 mL | ORAL | Status: DC

## 2023-11-18 MED ORDER — KETOROLAC TROMETHAMINE 30 MG/ML IJ SOLN: 30.0000 mg | Freq: Four times a day (QID) | INTRAMUSCULAR | Status: AC

## 2023-11-18 MED ORDER — DIBUCAINE (PERIANAL) 1 % EX OINT: 1.0000 | TOPICAL_OINTMENT | CUTANEOUS | Status: DC | PRN

## 2023-11-18 MED ORDER — LACTATED RINGERS IV SOLN: INTRAVENOUS | Status: DC

## 2023-11-18 MED ORDER — ACETAMINOPHEN 500 MG PO TABS
1000.0000 mg | ORAL_TABLET | Freq: Four times a day (QID) | ORAL | Status: DC
  Administered 2023-11-18 – 2023-11-20 (×8): 1000 mg via ORAL
  Filled 2023-11-18 (×8): qty 2

## 2023-11-18 MED ORDER — KETOROLAC TROMETHAMINE 30 MG/ML IJ SOLN: 30.0000 mg | Freq: Four times a day (QID) | INTRAMUSCULAR | Status: DC | PRN

## 2023-11-18 MED ORDER — POVIDONE-IODINE 10 % EX SWAB: 2.0000 | Freq: Once | CUTANEOUS | Status: DC

## 2023-11-18 MED ORDER — PRENATAL MULTIVITAMIN CH
1.0000 | ORAL_TABLET | Freq: Every day | ORAL | Status: DC
  Administered 2023-11-18 – 2023-11-20 (×3): 1 via ORAL
  Filled 2023-11-18 (×3): qty 1

## 2023-11-18 MED ORDER — ONDANSETRON HCL 4 MG/2ML IJ SOLN: 4.0000 mg | Freq: Three times a day (TID) | INTRAMUSCULAR | Status: DC | PRN

## 2023-11-18 MED ORDER — COCONUT OIL OIL: 1.0000 | TOPICAL_OIL | Status: DC | PRN

## 2023-11-18 MED ORDER — FENTANYL CITRATE (PF) 100 MCG/2ML IJ SOLN
INTRAMUSCULAR | Status: AC
  Filled 2023-11-18: qty 2

## 2023-11-18 MED ORDER — NALOXONE HCL 4 MG/10ML IJ SOLN: 1.0000 ug/kg/h | INTRAVENOUS | Status: DC | PRN

## 2023-11-18 MED ORDER — PHENYLEPHRINE HCL-NACL 20-0.9 MG/250ML-% IV SOLN
INTRAVENOUS | Status: DC | PRN
  Administered 2023-11-18: 60 ug/min via INTRAVENOUS

## 2023-11-18 MED ORDER — DIPHENHYDRAMINE HCL 25 MG PO CAPS: 25.0000 mg | ORAL_CAPSULE | Freq: Four times a day (QID) | ORAL | Status: DC | PRN

## 2023-11-18 MED ORDER — MENTHOL 3 MG MT LOZG
1.0000 | LOZENGE | OROMUCOSAL | Status: DC | PRN
Start: 2023-11-18 — End: 2023-11-20

## 2023-11-18 MED ORDER — ZOLPIDEM TARTRATE 5 MG PO TABS: 5.0000 mg | ORAL_TABLET | Freq: Every evening | ORAL | Status: DC | PRN

## 2023-11-18 MED ORDER — ONDANSETRON HCL 4 MG/2ML IJ SOLN
INTRAMUSCULAR | Status: AC
  Filled 2023-11-18: qty 2

## 2023-11-18 MED ORDER — NALOXONE HCL 0.4 MG/ML IJ SOLN
0.4000 mg | INTRAMUSCULAR | Status: DC | PRN
Start: 2023-11-18 — End: 2023-11-20

## 2023-11-18 MED ORDER — BUPIVACAINE IN DEXTROSE 0.75-8.25 % IT SOLN
INTRATHECAL | Status: DC | PRN
  Administered 2023-11-18: 1.6 mL via INTRATHECAL

## 2023-11-18 MED ORDER — SODIUM CHLORIDE 0.9 % IR SOLN
Status: DC | PRN
  Administered 2023-11-18: 1000 mL

## 2023-11-18 SURGICAL SUPPLY — 31 items
BENZOIN TINCTURE PRP APPL 2/3 (GAUZE/BANDAGES/DRESSINGS) IMPLANT
CHLORAPREP W/TINT 26 (MISCELLANEOUS) ×2 IMPLANT
CLAMP UMBILICAL CORD (MISCELLANEOUS) ×1 IMPLANT
CLOTH BEACON ORANGE TIMEOUT ST (SAFETY) ×1 IMPLANT
DERMABOND ADVANCED .7 DNX12 (GAUZE/BANDAGES/DRESSINGS) IMPLANT
DRSG OPSITE POSTOP 4X10 (GAUZE/BANDAGES/DRESSINGS) ×1 IMPLANT
ELECTRODE REM PT RTRN 9FT ADLT (ELECTROSURGICAL) ×1 IMPLANT
EXTRACTOR VACUUM M CUP 4 TUBE (SUCTIONS) IMPLANT
GAUZE PAD ABD 7.5X8 STRL (GAUZE/BANDAGES/DRESSINGS) IMPLANT
GAUZE SPONGE 4X4 12PLY STRL LF (GAUZE/BANDAGES/DRESSINGS) IMPLANT
GLOVE BIO SURGEON STRL SZ7.5 (GLOVE) ×1 IMPLANT
GLOVE BIOGEL PI IND STRL 7.0 (GLOVE) ×1 IMPLANT
GOWN SRG XL 47XLVL 4 REINF (GOWN DISPOSABLE) ×1 IMPLANT
GOWN STRL REUS W/TWL LRG LVL3 (GOWN DISPOSABLE) ×1 IMPLANT
KIT ABG SYR 3ML LUER SLIP (SYRINGE) ×1 IMPLANT
NDL HYPO 25X5/8 SAFETYGLIDE (NEEDLE) ×1 IMPLANT
NEEDLE HYPO 25X5/8 SAFETYGLIDE (NEEDLE) ×1 IMPLANT
NS IRRIG 1000ML POUR BTL (IV SOLUTION) ×1 IMPLANT
PACK C SECTION WH (CUSTOM PROCEDURE TRAY) ×1 IMPLANT
PAD OB MATERNITY 4.3X12.25 (PERSONAL CARE ITEMS) ×1 IMPLANT
STRIP CLOSURE SKIN 1/2X4 (GAUZE/BANDAGES/DRESSINGS) IMPLANT
SUT MNCRL 0 VIOLET CTX 36 (SUTURE) ×4 IMPLANT
SUT PDS AB 0 CTX 60 (SUTURE) ×1 IMPLANT
SUT PLAIN 0 NONE (SUTURE) IMPLANT
SUT PLAIN 2 0 XLH (SUTURE) IMPLANT
SUT PLAIN ABS 2-0 CT1 27XMFL (SUTURE) IMPLANT
SUT VIC AB 4-0 KS 27 (SUTURE) ×1 IMPLANT
SUTURE PLAIN GUT 2.0 ETHICON (SUTURE) IMPLANT
TOWEL OR 17X24 6PK STRL BLUE (TOWEL DISPOSABLE) ×1 IMPLANT
TRAY FOLEY W/BAG SLVR 14FR LF (SET/KITS/TRAYS/PACK) ×1 IMPLANT
WATER STERILE IRR 1000ML POUR (IV SOLUTION) ×1 IMPLANT

## 2023-11-18 NOTE — Lactation Note (Signed)
 This note was copied from a baby's chart. Lactation Consultation Note  Patient Name: Sheena Moore Unijb'd Date: 11/18/2023 Age:31 hours Reason for consult: Initial assessment;Term  P2- MOB reports that infant has been nursing really well since delivery. Per MOB her first child had severe issues with latching up until 32 months of age. She even dealt with thrush due to the poor latching/feedings. MOB feels like this infant is a natural and feeds much better than her sister. Per MOB, infant had just finished feeding an hour ago for 20 minutes. LC encouraged MOB to call for a latch assessment before discharge.  LC reviewed the first 24 hr birthday nap, day 2 cluster feeding, feeding infant on cue 8-12x in 24 hrs, not allowing infant to go over 3 hrs without a feeding, CDC milk storage guidelines, LC services handout and engorgement/breast care. LC encouraged MOB to call for further assistance as needed.  Maternal Data Has patient been taught Hand Expression?: Yes Does the patient have breastfeeding experience prior to this delivery?: Yes How long did the patient breastfeed?: 1 year, but had severe issues up to 36 months of age. Had assistance from an outpatient LC.  Feeding Mother's Current Feeding Choice: Breast Milk  Lactation Tools Discussed/Used Pump Education: Milk Storage  Interventions Interventions: Breast feeding basics reviewed;Education;LC Services brochure  Discharge Discharge Education: Engorgement and breast care;Warning signs for feeding baby Pump: DEBP (Has two Spectras and one Medela)  Consult Status Consult Status: Follow-up Date: 11/19/23 Follow-up type: In-patient    Recardo Hoit BS, IBCLC 11/18/2023, 6:31 PM

## 2023-11-18 NOTE — Anesthesia Preprocedure Evaluation (Signed)
 Anesthesia Evaluation  Patient identified by MRN, date of birth, ID band Patient awake  General Assessment Comment:  Secondary c/s  Reviewed: Allergy & Precautions, NPO status , Patient's Chart, lab work & pertinent test results  History of Anesthesia Complications Negative for: history of anesthetic complications  Airway Mallampati: II  TM Distance: >3 FB Neck ROM: Full    Dental no notable dental hx. (+) Teeth Intact   Pulmonary neg pulmonary ROS, neg sleep apnea, neg COPD, Patient abstained from smoking.Not current smoker   Pulmonary exam normal breath sounds clear to auscultation       Cardiovascular Exercise Tolerance: Good METS(-) hypertension(-) CAD and (-) Past MI negative cardio ROS (-) dysrhythmias  Rhythm:Regular Rate:Normal - Systolic murmurs    Neuro/Psych negative neurological ROS  negative psych ROS   GI/Hepatic ,neg GERD  ,,(+)     (-) substance abuse    Endo/Other  neg diabetes    Renal/GU negative Renal ROS     Musculoskeletal   Abdominal   Peds  Hematology Denies blood thinner use or bleeding disorders.    Anesthesia Other Findings Past Medical History: No date: Medical history non-contributory  Reproductive/Obstetrics (+) Pregnancy                              Anesthesia Physical Anesthesia Plan  ASA: 2  Anesthesia Plan: Spinal   Post-op Pain Management:    Induction:   PONV Risk Score and Plan: 3 and Ondansetron  and Dexamethasone   Airway Management Planned: Natural Airway  Additional Equipment:   Intra-op Plan:   Post-operative Plan:   Informed Consent: I have reviewed the patients History and Physical, chart, labs and discussed the procedure including the risks, benefits and alternatives for the proposed anesthesia with the patient or authorized representative who has indicated his/her understanding and acceptance.       Plan Discussed  with: CRNA and Surgeon  Anesthesia Plan Comments: (Discussed R/B/A of neuraxial anesthesia technique with patient: - rare risks of spinal/epidural hematoma, nerve damage, infection - Risk of PDPH - Risk of itching - Risk of nausea and vomiting - Risk of conversion to general anesthesia and its associated risks, including sore throat, damage to lips/teeth/oropharynx, and rare risks such as cardiac and respiratory events. - Risk of surgical bleeding requiring blood products - Risk of allergic reactions Discussed the role of CRNA in patient's perioperative care.  Patient voiced understanding.)        Anesthesia Quick Evaluation

## 2023-11-18 NOTE — Transfer of Care (Signed)
 Immediate Anesthesia Transfer of Care Note  Patient: Sheena Moore  Procedure(s) Performed: CESAREAN DELIVERY  Patient Location: PACU  Anesthesia Type:Spinal  Level of Consciousness: awake, alert , and oriented  Airway & Oxygen Therapy: Patient Spontanous Breathing  Post-op Assessment: Report given to RN and Post -op Vital signs reviewed and stable  Post vital signs: Reviewed and stable  Last Vitals:  Vitals Value Taken Time  BP 120/80 11/18/23 08:51  Temp    Pulse 95 11/18/23 08:54  Resp 20 11/18/23 08:54  SpO2 98 % 11/18/23 08:54  Vitals shown include unfiled device data.  Last Pain:  Vitals:   11/18/23 0603  TempSrc:   PainSc: 0-No pain         Complications: No notable events documented.

## 2023-11-18 NOTE — Brief Op Note (Signed)
 11/18/2023  8:40 AM  PATIENT:  Sheena Moore  33 y.o. female  PRE-OPERATIVE DIAGNOSIS:  Repeat cesarean section  POST-OPERATIVE DIAGNOSIS:  Repeat cesarean section  PROCEDURE:  Procedure(s): CESAREAN DELIVERY (N/A)  SURGEON:  Surgeons and Role:    * Curlene Agent, MD - Primary  PHYSICIAN ASSISTANT:   ASSISTANTS:   ANESTHESIA:   spinal  EBL:  226   BLOOD ADMINISTERED:none  DRAINS: Urinary Catheter (Foley)   LOCAL MEDICATIONS USED:  NONE  SPECIMEN:  No Specimen  DISPOSITION OF SPECIMEN:  N/A  COUNTS:  YES  TOURNIQUET:  * No tourniquets in log *  DICTATION: .Other Dictation: Dictation Number [id number]  PLAN OF CARE: Admit to inpatient   PATIENT DISPOSITION:  PACU - hemodynamically stable.   Delay start of Pharmacological VTE agent (>24hrs) due to surgical blood loss or risk of bleeding: not applicable

## 2023-11-18 NOTE — Progress Notes (Signed)
 No changes to H&P per patient history D/W procedure-cesarean section NKDA All questions answered. She states she understands and agrees

## 2023-11-18 NOTE — Progress Notes (Signed)
 ISS [id number]

## 2023-11-18 NOTE — Op Note (Signed)
 NAME: Sheena Moore, YEH MEDICAL RECORD NO: [id number] ACCOUNT NO: [id number] DATE OF BIRTH: April 04, 1991 FACILITY: [facility] LOCATION: [facility] PHYSICIAN: Lynwood BRAVO. Shynice Sigel II, MD  Operative Report   DATE OF PROCEDURE: 11/18/2023  PREOPERATIVE DIAGNOSIS:  Desires repeat cesarean section.  POSTOPERATIVE DIAGNOSIS:  Desires repeat cesarean section.  PROCEDURE:  Low transverse cesarean section.  SURGEON:  Sosha Shepherd E. Jacqulyn Barresi II, MD  ANESTHESIA:  Dr. Darlyn, Spinal.  ESTIMATED BLOOD LOSS:  226 mL.  SPECIMENS:  None.  FINDINGS:  Viable female infant. Apgars and birth weight pending.  INDICATIONS AND CONSENT:  This patient is a 33 year old G2, P1 at 87 and 0/7 weeks with a previous cesarean section.  She desires repeat.  Potential risks and complications were discussed preoperatively including, but not limited to infection, organ  damage, bleeding requiring transfusion of blood products with HIV and hepatitis acquisition, DVT, PE, pneumonia, and wound breakdown.  She states she understands and agrees, and consent is signed and is on the chart.  DESCRIPTION OF PROCEDURE:  The patient was taken to the operating room where she was identified.  Spinal anesthetic was administered per anesthesiology, and she was placed in the dorsal supine position with a 15-degree left lateral wedge.  She was  prepped vaginally with Betadine .  Foley catheter was placed and prepped abdominally with ChloraPrep.  Timeout was done.  After draping in a sterile fashion, the spinal anesthetic was checked and found to be adequate.  Skin was entered through a  Pfannenstiel incision taking out the old scar on the way in.  Dissection was carried out in layers to the peritoneum, which was taken down superiorly and inferiorly.  Vesicouterine peritoneum was dissected down.  The bladder blade was placed.  Uterus was  incised in a low transverse manner, and the uterine cavity was entered bluntly with a hemostat.  Clear fluid was noted.   The incision was extended bilaterally with the fingers.  Vertex was then delivered through the incision with a vacuum extractor.   There was a loose nuchal cord x1 noted.  Baby was delivered.  Cord was clamped and cut and hand to the waiting pediatrics team.  Placenta was delivered.  Uterus was clean.  Uterus was closed in a running locking fashion with 2 imbricating layers of 0  Monocryl suture, which achieved good hemostasis.  Tubes and ovaries were normal bilaterally.  Lavage was carried out, and all returns were clear.  Anterior peritoneum was closed in a running fashion with 0 Monocryl suture, which was also used to  reapproximate the pyramidalis muscle in the midline.  Anterior rectus fascia was closed in a running fashion with a 0 looped PDS.  This starts at each angle and then meets in the middle on the right side.  The subcuticular layer was closed with  interrupted plain.  The skin was closed in a subcuticular fashion with 4-0 Vicryl on a Keith needle.  Benzoin, Steri-Strips, honeycomb, and pressure dressing were applied.  All counts were correct.  The patient was taken to the recovery room in stable  condition.   PUS D: 11/18/2023 8:46:08 am T: 11/18/2023 12:28:00 pm  JOB: [id number]/ [id number]

## 2023-11-18 NOTE — Anesthesia Procedure Notes (Signed)
 Spinal  Patient location during procedure: OR Start time: 11/18/2023 7:35 AM End time: 11/18/2023 7:38 AM Reason for block: surgical anesthesia Staffing Performed: anesthesiologist  Anesthesiologist: Boone Fess, MD Performed by: Boone Fess, MD Authorized by: Boone Fess, MD   Preanesthetic Checklist Completed: patient identified, IV checked, site marked, risks and benefits discussed, surgical consent, monitors and equipment checked, pre-op evaluation and timeout performed Spinal Block Patient position: sitting Prep: ChloraPrep and site prepped and draped Patient monitoring: heart rate, continuous pulse ox, blood pressure and cardiac monitor Approach: midline Location: L3-4 Injection technique: single-shot Needle Needle type: Whitacre and Introducer  Needle gauge: 24 G Needle length: 9 cm Assessment Sensory level: T10 Events: CSF return Additional Notes Meticulous sterile technique used throughout (CHG prep, sterile gloves, sterile drape). Negative paresthesia. Negative blood return. Positive free-flowing CSF. Expiration date of kit checked and confirmed. Patient tolerated procedure well, without complications.

## 2023-11-18 NOTE — Anesthesia Postprocedure Evaluation (Signed)
 Anesthesia Post Note  Patient: Sheena Moore  Procedure(s) Performed: CESAREAN DELIVERY     Patient location during evaluation: PACU Anesthesia Type: Spinal Level of consciousness: oriented and awake and alert Pain management: pain level controlled Vital Signs Assessment: post-procedure vital signs reviewed and stable Respiratory status: spontaneous breathing, respiratory function stable and patient connected to nasal cannula oxygen Cardiovascular status: blood pressure returned to baseline and stable Postop Assessment: no headache, no backache, no apparent nausea or vomiting and spinal receding Anesthetic complications: no   No notable events documented.  Last Vitals:  Vitals:   11/18/23 0930 11/18/23 0945  BP: 110/75 119/71  Pulse: 85 84  Resp: 18 18  Temp:    SpO2: 91% 94%    Last Pain:  Vitals:   11/18/23 0910  TempSrc:   PainSc: 1                  Rome Ade

## 2023-11-19 LAB — CBC
HCT: 30.5 % — ABNORMAL LOW (ref 36.0–46.0)
Hemoglobin: 10.5 g/dL — ABNORMAL LOW (ref 12.0–15.0)
MCH: 33.8 pg (ref 26.0–34.0)
MCHC: 34.4 g/dL (ref 30.0–36.0)
MCV: 98.1 fL (ref 80.0–100.0)
Platelets: 201 K/uL (ref 150–400)
RBC: 3.11 MIL/uL — ABNORMAL LOW (ref 3.87–5.11)
RDW: 13.2 % (ref 11.5–15.5)
WBC: 11.7 K/uL — ABNORMAL HIGH (ref 4.0–10.5)
nRBC: 0 % (ref 0.0–0.2)

## 2023-11-19 LAB — BIRTH TISSUE RECOVERY COLLECTION (PLACENTA DONATION)

## 2023-11-19 MED ORDER — CALCIUM CARBONATE ANTACID 500 MG PO CHEW
1.0000 | CHEWABLE_TABLET | ORAL | Status: DC | PRN
  Administered 2023-11-19: 200 mg via ORAL
  Filled 2023-11-19: qty 1

## 2023-11-19 MED ORDER — FAMOTIDINE 20 MG PO TABS: 20.0000 mg | ORAL_TABLET | Freq: Every evening | ORAL | Status: DC | PRN

## 2023-11-19 NOTE — Lactation Note (Signed)
 This note was copied from a baby's chart. Lactation Consultation Note  Patient Name: Girl Mckell Riecke Unijb'd Date: 11/19/2023 Age:30 hours Reason for consult: Follow-up assessment;Term  P2, 39 wks, @ 34 hrs of life. Experienced breast feeder- had challenges with last baby but continued for a solid year. Per mom this baby is doing better then her first. Highlighted our breasts respond faster/easier to milk production with each baby. Highlighted expectations of baby coming into cluster feeding- Dad shares they are there! Encouraged starting with hand expression and using breast compression through feeding will keep baby working @ breast. Coconut oil provided, encouraged nipple care after each feed. Encouraged overnight to offer both breasts as able. Discussed pumping @ home, updating flange sizes as needed. Mom has an out in town [facility] to assist after DC.   Maternal Data Does the patient have breastfeeding experience prior to this delivery?: Yes How long did the patient breastfeed?: 1 year  Feeding Mother's Current Feeding Choice: Breast Milk   Lactation Tools Discussed/Used Pump Education: Milk Storage  Interventions Interventions: Breast feeding basics reviewed;Hand express;Breast compression;Expressed milk;Coconut oil;Education;LC Services brochure;CDC milk storage guidelines  Discharge Pump: DEBP;Manual;Hands Free;Personal  Consult Status Consult Status: Follow-up Date: 11/20/23 Follow-up type: In-patient    Anastasia Tompson 11/19/2023, 6:39 PM

## 2023-11-19 NOTE — Progress Notes (Signed)
 Postpartum Progress Note  S: No complaints. Feeling well. Lochia appropriate. No subjective fevers/chills, Cp, or SOB. Ambulating.   O:     11/19/2023    5:38 AM 11/18/2023   11:59 PM 11/18/2023    8:40 PM  Vitals with BMI  Systolic 102 111 885  Diastolic 64 58 68  Pulse 67 60 74    Gen: NAD, A&O Pulm: NWOB Abd: soft, appropriately ttp, fundus firm and below Umb. Incision c/d after pressure bandage removed Ext: No evidence of DVT, trace edema b/l  UOP adequate.   Labs Recent Results (from the past 2160 hours)  OB RESULT CONSOLE Group B Strep     Status: None   Collection Time: 11/08/23 12:00 AM  Result Value Ref Range   GBS Negative   Type and screen [facility]     Status: None   Collection Time: 11/16/23  9:11 AM  Result Value Ref Range   ABO/RH(D) A NEG    Antibody Screen POS    Sample Expiration 11/19/2023,2359    Antibody Identification      PASSIVELY ACQUIRED ANTI-D Performed at [facility], [address] [place], [place] [address]   CBC     Status: Abnormal   Collection Time: 11/16/23  9:30 AM  Result Value Ref Range   WBC 9.3 4.0 - 10.5 K/uL   RBC 3.70 (L) 3.87 - 5.11 MIL/uL   Hemoglobin 12.1 12.0 - 15.0 g/dL   HCT 63.5 63.9 - 53.9 %   MCV 98.4 80.0 - 100.0 fL   MCH 32.7 26.0 - 34.0 pg   MCHC 33.2 30.0 - 36.0 g/dL   RDW 86.7 88.4 - 84.4 %   Platelets 245 150 - 400 K/uL   nRBC 0.0 0.0 - 0.2 %    Comment: Performed at [facility], [address] [place], [place] [address]  RPR     Status: None   Collection Time: 11/16/23  9:30 AM  Result Value Ref Range   RPR Ser Ql NON REACTIVE NON REACTIVE    Comment: Performed at [facility], [address] [place], [place] [address]  CBC     Status: Abnormal   Collection Time: 11/19/23  4:44 AM  Result Value Ref Range   WBC 11.7 (H) 4.0 - 10.5 K/uL   RBC 3.11 (L) 3.87 - 5.11 MIL/uL   Hemoglobin 10.5 (L) 12.0 - 15.0 g/dL   HCT 69.4 (L) 63.9 - 53.9 %   MCV 98.1 80.0 -  100.0 fL   MCH 33.8 26.0 - 34.0 pg   MCHC 34.4 30.0 - 36.0 g/dL   RDW 86.7 88.4 - 84.4 %   Platelets 201 150 - 400 K/uL   nRBC 0.0 0.0 - 0.2 %    Comment: Performed at [facility], [address], [place], [place] [address]  Collect bld for placenta donatation     Status: None   Collection Time: 11/19/23  4:44 AM  Result Value Ref Range   Placenta donation bld collect Collected by Laboratory     Comment: Performed at [facility], [address] [place], [place] [address]  Rh IG workup (includes ABO/Rh)     Status: None (Preliminary result)   Collection Time: 11/19/23  4:44 AM  Result Value Ref Range   Gestational Age(Wks) 39    Fetal Screen      NEG Performed at [facility], [address]  [place], [place] [address]    Unit Number [id number]    Blood Component Type RHIG    Unit division 00    Status of Unit ALLOCATED    Transfusion Status OK TO TRANSFUSE      A/P:  POD1 s/p rCS, doing well pp. AFVSS. Benign exam. No other sig med/surg issues.  Continue present care.  Plan for d/c POD#2 or 3.   Slater Door, MD

## 2023-11-20 LAB — RH IG WORKUP (INCLUDES ABO/RH)
Fetal Screen: NEGATIVE
Gestational Age(Wks): 39
Unit division: 0

## 2023-11-20 MED ORDER — OXYCODONE HCL 5 MG PO TABS: 5.0000 mg | ORAL_TABLET | ORAL | 0 refills | Status: AC | PRN

## 2023-11-20 MED ORDER — ACETAMINOPHEN 500 MG PO TABS: 1000.0000 mg | ORAL_TABLET | Freq: Four times a day (QID) | ORAL | 0 refills | Status: AC

## 2023-11-20 MED ORDER — SENNOSIDES-DOCUSATE SODIUM 8.6-50 MG PO TABS: 2.0000 | ORAL_TABLET | Freq: Every day | ORAL | 1 refills | Status: AC

## 2023-11-20 NOTE — Discharge Summary (Signed)
 Postpartum Discharge Summary  Date of Service updated 11/20/2023     Patient Name: Sheena Moore DOB: [birth year] MRN: [id number]  Date of admission: 11/18/2023 Delivery date:11/18/2023 Delivering provider: TOMBLIN, JAMES Date of discharge: 11/20/2023  Admitting diagnosis: Pregnancy [Z34.90] History of cesarean section [Z98.891] Intrauterine pregnancy: [redacted]w[redacted]d     Secondary diagnosis:  Principal Problem:   Pregnancy Active Problems:   History of cesarean section  Additional problems: none    Discharge diagnosis: Term Pregnancy Delivered                                              Post partum procedures:None Augmentation: N/A Complications: None  Hospital course: Scheduled C/S   33 y.o. yo G2P2002 at [redacted]w[redacted]d was admitted to the hospital 11/18/2023 for scheduled cesarean section with the following indication:Elective Repeat.Delivery details are as follows:  Membrane Rupture Time/Date: 8:00 AM,11/18/2023  Delivery Method:C-Section, Vacuum Assisted Details of operation can be found in separate operative note.  Patient had a postpartum course complicated by none.  She is ambulating, tolerating a regular diet, passing flatus, and urinating well. Patient is discharged home in stable condition on  11/20/23        Newborn Data: Birth date:11/18/2023 Birth time:8:03 AM Gender:Female Living status:Living Apgars:6 ,8  Weight:4040 g    Magnesium Sulfate received: No BMZ received: No Rhophylac :Yes MMR:N/A T-DaP:Given prenatally Transfusion:No Immunizations administered: There is no immunization history for the selected administration types on file for this patient.  Physical exam  Vitals:   11/19/23 1213 11/19/23 1421 11/19/23 2111 11/20/23 0421  BP:  108/77 121/68 109/79  Pulse:  81 81 66  Resp:  17 16 18   Temp: 98.3 F (36.8 C) 98.5 F (36.9 C) 98.3 F (36.8 C) 98 F (36.7 C)  TempSrc: Oral Oral Oral Oral  SpO2:  97% 100% 97%  Weight:      Height:       General:  alert and cooperative Lochia: appropriate Uterine Fundus: firm Incision: Healing well with no significant drainage, Dressing is clean, dry, and intact DVT Evaluation: No evidence of DVT seen on physical exam. Labs: Lab Results  Component Value Date   WBC 11.7 (H) 11/19/2023   HGB 10.5 (L) 11/19/2023   HCT 30.5 (L) 11/19/2023   MCV 98.1 11/19/2023   PLT 201 11/19/2023       No data to display         Edinburgh Score:    11/18/2023    4:46 PM  Edinburgh Postnatal Depression Scale Screening Tool  I have been able to laugh and see the funny side of things. 0  I have looked forward with enjoyment to things. 0  I have blamed myself unnecessarily when things went wrong. 0  I have been anxious or worried for no good reason. 0  I have felt scared or panicky for no good reason. 0  Things have been getting on top of me. 0  I have been so unhappy that I have had difficulty sleeping. 0  I have felt sad or miserable. 0  I have been so unhappy that I have been crying. 0  The thought of harming myself has occurred to me. 0  Edinburgh Postnatal Depression Scale Total 0      After visit meds:  Allergies as of 11/20/2023   No Known Allergies  Medication List     TAKE these medications    acetaminophen  500 MG tablet Commonly known as: TYLENOL  Take 2 tablets (1,000 mg total) by mouth every 6 (six) hours.   calcium  carbonate 500 MG chewable tablet Commonly known as: TUMS - dosed in mg elemental calcium  Chew 1 tablet by mouth as needed for indigestion or heartburn.   oxyCODONE  5 MG immediate release tablet Commonly known as: Oxy IR/ROXICODONE  Take 1 tablet (5 mg total) by mouth every 4 (four) hours as needed for moderate pain (pain score 4-6).   prenatal multivitamin Tabs tablet Take 1 tablet by mouth at bedtime.   senna-docusate 8.6-50 MG tablet Commonly known as: Senokot-S Take 2 tablets by mouth daily. Start taking on: November 21, 2023         Discharge home in  stable condition Infant Feeding: Bottle and Breast Infant Disposition:home with mother Discharge instruction: per After Visit Summary and Postpartum booklet. Activity: Advance as tolerated. Pelvic rest for 6 weeks.  Diet: routine diet Anticipated Birth Control: Unsure Postpartum Appointment:6 weeks     11/20/2023 Slater JINNY Door, MD

## 2023-11-30 ENCOUNTER — Telehealth (HOSPITAL_COMMUNITY): Payer: Self-pay

## 2023-11-30 NOTE — Telephone Encounter (Signed)
 11/30/2023 1513  Name: Sheena Moore MRN: [id number] DOB: Oct 31, 1990  Reason for Call:  Transition of Care Hospital Discharge Call  Contact Status: Patient Contact Status: Complete  Language assistant needed:          Follow-Up Questions: Do You Have Any Concerns About Your Health As You Heal From Delivery?: Yes What Concerns Do You Have About Your Health?: Patient states that her dressing is off and her steri strips. She states that her incision is healing well. RN reviewed incision care with patient. Patient has no questions or concerns about her health or healing. Do You Have Any Concerns About Your Infants Health?: No  Edinburgh Postnatal Depression Scale:  In the Past 7 Days: I have been able to laugh and see the funny side of things.: As much as I always could I have looked forward with enjoyment to things.: As much as I ever did I have blamed myself unnecessarily when things went wrong.: No, never I have been anxious or worried for no good reason.: No, not at all I have felt scared or panicky for no good reason.: No, not at all Things have been getting on top of me.: No, I have been coping as well as ever I have been so unhappy that I have had difficulty sleeping.: Not at all I have felt sad or miserable.: No, not at all I have been so unhappy that I have been crying.: No, never The thought of harming myself has occurred to me.: Never [place] Postnatal Depression Scale Total: 0  PHQ2-9 Depression Scale:     Discharge Follow-up: Edinburgh score requires follow up?: No Patient was advised of the following resources:: Breastfeeding Support Group, Support Group  Post-discharge interventions: Reviewed Newborn Safe Sleep Practices  Signature  Rosaline Deretha PEAK
